# Patient Record
Sex: Female | Born: 1975 | Race: Black or African American | Hispanic: No | Marital: Single | Smoking: Never smoker
Health system: Southern US, Community
[De-identification: ages and names within clinical notes are randomized; demographics above are authoritative.]

## PROBLEM LIST (undated history)

## (undated) DIAGNOSIS — I10 Essential (primary) hypertension: Secondary | ICD-10-CM

## (undated) DIAGNOSIS — K8020 Calculus of gallbladder without cholecystitis without obstruction: Secondary | ICD-10-CM

## (undated) HISTORY — DX: Calculus of gallbladder without cholecystitis without obstruction: K80.20

## (undated) SURGERY — LAPAROSCOPIC CHOLECYSTECTOMY WITH INTRAOPERATIVE CHOLANGIOGRAM
Anesthesia: General

---

## 2008-09-12 ENCOUNTER — Emergency Department: Payer: Self-pay | Admitting: Internal Medicine

## 2011-04-02 ENCOUNTER — Emergency Department: Payer: Self-pay | Admitting: *Deleted

## 2011-04-05 ENCOUNTER — Emergency Department (HOSPITAL_COMMUNITY): Payer: BC Managed Care – PPO

## 2011-04-05 ENCOUNTER — Encounter: Payer: Self-pay | Admitting: Emergency Medicine

## 2011-04-05 ENCOUNTER — Emergency Department (HOSPITAL_COMMUNITY)
Admission: EM | Admit: 2011-04-05 | Discharge: 2011-04-05 | Disposition: A | Discharge status: Home / Self Care | Payer: BC Managed Care – PPO | Attending: Emergency Medicine | Admitting: Emergency Medicine

## 2011-04-05 DIAGNOSIS — R6883 Chills (without fever): Secondary | ICD-10-CM | POA: Insufficient documentation

## 2011-04-05 DIAGNOSIS — R112 Nausea with vomiting, unspecified: Secondary | ICD-10-CM | POA: Insufficient documentation

## 2011-04-05 DIAGNOSIS — I10 Essential (primary) hypertension: Secondary | ICD-10-CM | POA: Insufficient documentation

## 2011-04-05 DIAGNOSIS — R1013 Epigastric pain: Secondary | ICD-10-CM | POA: Insufficient documentation

## 2011-04-05 DIAGNOSIS — K8020 Calculus of gallbladder without cholecystitis without obstruction: Secondary | ICD-10-CM | POA: Insufficient documentation

## 2011-04-05 DIAGNOSIS — R10819 Abdominal tenderness, unspecified site: Secondary | ICD-10-CM | POA: Insufficient documentation

## 2011-04-05 DIAGNOSIS — Z79899 Other long term (current) drug therapy: Secondary | ICD-10-CM | POA: Insufficient documentation

## 2011-04-05 HISTORY — DX: Essential (primary) hypertension: I10

## 2011-04-05 LAB — LIPASE, BLOOD: Lipase: 20 U/L (ref 11–59)

## 2011-04-05 LAB — COMPREHENSIVE METABOLIC PANEL WITH GFR
ALT: 27 U/L (ref 0–35)
AST: 20 U/L (ref 0–37)
Albumin: 3.6 g/dL (ref 3.5–5.2)
Alkaline Phosphatase: 53 U/L (ref 39–117)
BUN: 9 mg/dL (ref 6–23)
CO2: 26 meq/L (ref 19–32)
Calcium: 9.6 mg/dL (ref 8.4–10.5)
Chloride: 101 meq/L (ref 96–112)
Creatinine, Ser: 0.72 mg/dL (ref 0.50–1.10)
GFR calc Af Amer: >90 mL/min (ref >90)
GFR calc non Af Amer: >90 mL/min (ref >90)
Glucose, Bld: 125 mg/dL — ABNORMAL HIGH (ref 70–99)
Potassium: 3.4 meq/L — ABNORMAL LOW (ref 3.5–5.1)
Sodium: 137 meq/L (ref 135–145)
Total Bilirubin: 0.3 mg/dL (ref 0.3–1.2)
Total Protein: 7.4 g/dL (ref 6.0–8.3)

## 2011-04-05 LAB — AMYLASE: Amylase: 54 U/L (ref 0–105)

## 2011-04-05 LAB — CBC
HCT: 41.4 % (ref 36.0–46.0)
Hemoglobin: 14.4 g/dL (ref 12.0–15.0)
MCH: 30.1 pg (ref 26.0–34.0)
MCHC: 34.8 g/dL (ref 30.0–36.0)
MCV: 86.6 fL (ref 78.0–100.0)
Platelets: 259 K/uL (ref 150–400)
RBC: 4.78 MIL/uL (ref 3.87–5.11)
RDW: 12.3 % (ref 11.5–15.5)
WBC: 10.1 K/uL (ref 4.0–10.5)

## 2011-04-05 MED ORDER — ONDANSETRON 8 MG PO TBDP: 8.0000 mg | ORAL_TABLET | Freq: Three times a day (TID) | ORAL | Status: AC | PRN

## 2011-04-05 MED ORDER — MORPHINE SULFATE 4 MG/ML IJ SOLN
4.0000 mg | Freq: Once | INTRAMUSCULAR | Status: AC
  Administered 2011-04-05: 4 mg via INTRAVENOUS
  Filled 2011-04-05: qty 1

## 2011-04-05 MED ORDER — ONDANSETRON HCL 4 MG/2ML IJ SOLN
4.0000 mg | Freq: Once | INTRAMUSCULAR | Status: AC
  Administered 2011-04-05: 4 mg via INTRAVENOUS
  Filled 2011-04-05: qty 2

## 2011-04-05 MED ORDER — HYDROCODONE-ACETAMINOPHEN 5-325 MG PO TABS: 1.0000 | ORAL_TABLET | ORAL | Status: DC | PRN

## 2011-04-05 NOTE — ED Provider Notes (Signed)
  Physical Exam  BP 107/64  Pulse 71  Temp(Src) 98.5 F (36.9 C) (Oral)  Resp 16  SpO2 100%  LMP 04/03/2011  Physical Exam  ED Course  Procedures  MDM US shows cholelithiasis w/out cholecystitis.  Results discussed w/ pt.  She is currently asymptomatic and tolerating pos.  D/c'd home w/ vicodin, zofran and referral to [facility].  Return precautions discussed.        Otilio Miu, [place] 04/05/11 [phone]

## 2011-04-05 NOTE — ED Notes (Signed)
PT. REPORTS UPPER ABDOMINAL PIAN ONSET THIS MORNING ( 2AM) WITH VOMITTING AND CHILLS , DENIES DIARRHEA.

## 2011-04-05 NOTE — ED Provider Notes (Signed)
History     CSN: [id number] Arrival date & time: 04/05/2011  3:39 AM   First MD Initiated Contact with Patient 04/05/11 0410      Chief Complaint  Patient presents with  . Abdominal Pain    (Consider location/radiation/quality/duration/timing/severity/associated sxs/prior treatment) Patient is a 35 y.o. female presenting with abdominal pain. The history is provided by the patient.  Abdominal Pain The primary symptoms of the illness include abdominal pain, nausea and vomiting. The primary symptoms of the illness do not include fever, fatigue, shortness of breath, diarrhea, hematemesis, hematochezia, dysuria, vaginal discharge or vaginal bleeding. The current episode started 3 to 5 hours ago. The onset of the illness was sudden.  Additional symptoms associated with the illness include chills. Symptoms associated with the illness do not include anorexia, diaphoresis, heartburn, constipation, urgency, hematuria, frequency or back pain.  Pt woke up at 2am this morning with severe epigastric pain and vomiting. She says that the same happened a few days ago. She went to [facility] where they drew blood but did not do any imaging. The patient is vomiting in exam room upon my arrival.   Past Medical History  Diagnosis Date  . Hypertension     Past Surgical History  Procedure Date  . Cesarean section     No family history on file.  History  Substance Use Topics  . Smoking status: Never Smoker   . Smokeless tobacco: Not on file  . Alcohol Use: No    OB History    Grav Para Term Preterm Abortions TAB SAB Ect Mult Living                  Review of Systems  Constitutional: Positive for chills. Negative for fever, diaphoresis and fatigue.  Respiratory: Negative for shortness of breath.   Gastrointestinal: Positive for nausea, vomiting and abdominal pain. Negative for heartburn, diarrhea, constipation, hematochezia, anorexia and hematemesis.  Genitourinary: Negative for dysuria,  urgency, frequency, hematuria, vaginal bleeding and vaginal discharge.  Musculoskeletal: Negative for back pain.  All other systems reviewed and are negative.    Allergies  Review of patient's allergies indicates no known allergies.  Home Medications   Current Outpatient Rx  Name Route Sig Dispense Refill  . HYDROCHLOROTHIAZIDE 12.5 MG PO TABS Oral Take 12.5 mg by mouth daily.      Carma Leaven M PLUS PO TABS Oral Take 1 tablet by mouth daily. Gummy     . POTASSIUM CHLORIDE 10 MEQ PO TBCR Oral Take 10 mEq by mouth daily.        BP 107/64  Pulse 71  Temp(Src) 98.5 F (36.9 C) (Oral)  Resp 16  SpO2 100%  LMP 04/03/2011  Physical Exam  Nursing note and vitals reviewed. Constitutional: She appears well-developed and well-nourished.  HENT:  Head: Normocephalic and atraumatic.  Eyes: Conjunctivae are normal. Pupils are equal, round, and reactive to light.  Neck: Trachea normal, normal range of motion and full passive range of motion without pain. Neck supple.  Cardiovascular: Normal rate, regular rhythm and normal pulses.   Pulmonary/Chest: Effort normal and breath sounds normal. Chest wall is not dull to percussion. She exhibits no tenderness, no crepitus, no edema, no deformity and no retraction.  Abdominal: Soft. Normal appearance and bowel sounds are normal. She exhibits no mass. There is tenderness (epigastric and some RUQ tenderness to palpation) in the right upper quadrant and epigastric area. There is no rebound, no guarding and no CVA tenderness.  Musculoskeletal: Normal range of motion.  Neurological: She is alert. She has normal strength.  Skin: Skin is warm, dry and intact.  Psychiatric: She has a normal mood and affect. Her speech is normal and behavior is normal. Judgment and thought content normal. Cognition and memory are normal.    ED Course  Procedures (including critical care time)  Labs Reviewed  COMPREHENSIVE METABOLIC PANEL - Abnormal; Notable for the  following:    Potassium 3.4 (*)    Glucose, Bld 125 (*)    All other components within normal limits  LIPASE, BLOOD  AMYLASE  CBC   No results found.   No diagnosis found.    MDM  Due to patients epigastric pain and this being her second visit in three days I am ordering a abdominal US. Katie Geologist, engineering PA-C has agreed to assume care of patient.        Dorthula Matas, PA 04/05/11 [phone]

## 2011-04-05 NOTE — ED Notes (Signed)
Patient presents with nausea and vomiting.  Was seen at [facility] on Tuesday and told she had a virus.  Pain N/V continues.  Pain to epigastric area that goes through to the back.  Vomiting yellow emesis

## 2011-04-05 NOTE — ED Notes (Signed)
Pt back from ultrasound, awaiting results.

## 2011-04-05 NOTE — ED Provider Notes (Signed)
Medical screening examination/treatment/procedure(s) were performed by non-physician practitioner and as supervising physician I was immediately available for consultation/collaboration.   Hanley Seamen, MD 04/05/11 [phone]

## 2011-04-05 NOTE — ED Notes (Signed)
MD at bedside. 

## 2011-04-07 ENCOUNTER — Ambulatory Visit (INDEPENDENT_AMBULATORY_CARE_PROVIDER_SITE_OTHER): Payer: BC Managed Care – PPO | Admitting: Surgery

## 2011-04-07 ENCOUNTER — Encounter (INDEPENDENT_AMBULATORY_CARE_PROVIDER_SITE_OTHER): Payer: Self-pay | Admitting: Surgery

## 2011-04-07 VITALS — BP 132/88 | HR 72 | Temp 97.4°F | Resp 16 | Ht 64.0 in | Wt 240.6 lb

## 2011-04-07 DIAGNOSIS — K8010 Calculus of gallbladder with chronic cholecystitis without obstruction: Secondary | ICD-10-CM | POA: Insufficient documentation

## 2011-04-07 NOTE — Progress Notes (Signed)
Patient ID: Autumn Farrell, female   DOB: [birth year], 35 y.o.   MRN: [id number]  Chief Complaint  Patient presents with  . Other    new pt- eval GB    HPI Autumn Farrell is a 35 y.o. female.  Referred by Dr. Read Drivers for gallstones HPI This patient presents with a one-year history of intermittent postprandial epigastric abdominal pain radiating through to her back associated with nausea and bloating. She denies any diarrhea. She had a severe episode last week for which she went to the emergency department at [facility]. She was diagnosed with gastroenteritis. The pain recurred on Saturday so she came to [facility]. Her white count and liver function tests were normal. An ultrasound showed multiple gallstones but no sign of cholecystitis. She is now referred for surgical evaluation. Past Medical History  Diagnosis Date  . Hypertension   . Gallstones     Past Surgical History  Procedure Date  . Cesarean section     History reviewed. No pertinent family history.  Social History History  Substance Use Topics  . Smoking status: Never Smoker   . Smokeless tobacco: Not on file  . Alcohol Use: No    No Known Allergies  Current Outpatient Prescriptions  Medication Sig Dispense Refill  . hydrochlorothiazide (HYDRODIURIL) 12.5 MG tablet Take 12.5 mg by mouth daily.        Marland Kitchen HYDROcodone-acetaminophen (NORCO) 5-325 MG per tablet Take 1 tablet by mouth every 4 (four) hours as needed for pain.  20 tablet  0  . Multiple Vitamins-Minerals (MULTIVITAMINS THER. W/MINERALS) TABS Take 1 tablet by mouth daily. Gummy       . ondansetron (ZOFRAN ODT) 8 MG disintegrating tablet Take 1 tablet (8 mg total) by mouth every 8 (eight) hours as needed for nausea.  20 tablet  0  . potassium chloride (KLOR-CON) 10 MEQ CR tablet Take 10 mEq by mouth daily.          Review of Systems Review of Systems  Constitutional: Negative for fever, chills and unexpected weight change.  HENT: Negative for hearing loss,  congestion, sore throat, trouble swallowing and voice change.   Eyes: Negative for visual disturbance.  Respiratory: Negative for cough and wheezing.   Cardiovascular: Negative for chest pain, palpitations and leg swelling.  Gastrointestinal: Positive for nausea, vomiting, abdominal pain and abdominal distention. Negative for diarrhea, constipation, blood in stool and anal bleeding.  Genitourinary: Negative for hematuria, vaginal bleeding and difficulty urinating.  Musculoskeletal: Negative for arthralgias.  Skin: Negative for rash and wound.  Neurological: Negative for seizures, syncope and headaches.  Hematological: Negative for adenopathy. Does not bruise/bleed easily.  Psychiatric/Behavioral: Negative for confusion.    Blood pressure 132/88, pulse 72, temperature 97.4 F (36.3 C), temperature source Temporal, resp. rate 16, height 5\' 4"  (1.626 m), weight 240 lb 9.6 oz (109.135 kg), last menstrual period 04/03/2011.  Physical Exam Physical Exam WDWN in NAD HEENT:  EOMI, sclera anicteric Neck:  No masses, no thyromegaly Lungs:  CTA bilaterally; normal respiratory effort CV:  Regular rate and rhythm; no murmurs Abd:  +bowel sounds, soft, mildly tender in the epigastrium; no palpable masses Ext:  Well-perfused; no edema Skin:  Warm, dry; no sign of jaundice  Data Reviewed U/S, labs from ED   Assessment    Chronic calculus cholecystitis     Plan    Laparoscopic cholecystectomy with intraoperative cholangiogram.  The surgical procedure has been discussed with the patient.  Potential risks, benefits, alternative treatments, and expected  outcomes have been explained.  All of the patient's questions at this time have been answered.  The likelihood of reaching the patient's treatment goal is good.  The patient understand the proposed surgical procedure and wishes to proceed.        Autumn Alicia K. 04/07/2011, 3:53 PM

## 2011-04-08 ENCOUNTER — Encounter (INDEPENDENT_AMBULATORY_CARE_PROVIDER_SITE_OTHER): Payer: Self-pay | Admitting: General Surgery

## 2011-04-08 ENCOUNTER — Encounter (HOSPITAL_COMMUNITY): Payer: Self-pay

## 2011-04-12 ENCOUNTER — Other Ambulatory Visit: Payer: Self-pay

## 2011-04-12 ENCOUNTER — Encounter (HOSPITAL_COMMUNITY): Payer: Self-pay | Admitting: Emergency Medicine

## 2011-04-12 ENCOUNTER — Inpatient Hospital Stay (HOSPITAL_COMMUNITY)
Admission: EM | Admit: 2011-04-12 | Discharge: 2011-04-14 | DRG: 494 | Disposition: A | Discharge status: Home / Self Care | Payer: BC Managed Care – PPO | Attending: General Surgery | Admitting: General Surgery

## 2011-04-12 ENCOUNTER — Emergency Department (HOSPITAL_COMMUNITY): Payer: BC Managed Care – PPO

## 2011-04-12 DIAGNOSIS — K8020 Calculus of gallbladder without cholecystitis without obstruction: Secondary | ICD-10-CM

## 2011-04-12 DIAGNOSIS — I10 Essential (primary) hypertension: Secondary | ICD-10-CM | POA: Diagnosis present

## 2011-04-12 DIAGNOSIS — K812 Acute cholecystitis with chronic cholecystitis: Secondary | ICD-10-CM | POA: Diagnosis present

## 2011-04-12 DIAGNOSIS — Z6841 Body Mass Index (BMI) 40.0 and over, adult: Secondary | ICD-10-CM

## 2011-04-12 DIAGNOSIS — K8000 Calculus of gallbladder with acute cholecystitis without obstruction: Principal | ICD-10-CM | POA: Diagnosis present

## 2011-04-12 DIAGNOSIS — I2510 Atherosclerotic heart disease of native coronary artery without angina pectoris: Secondary | ICD-10-CM | POA: Diagnosis present

## 2011-04-12 DIAGNOSIS — R1011 Right upper quadrant pain: Secondary | ICD-10-CM

## 2011-04-12 DIAGNOSIS — R112 Nausea with vomiting, unspecified: Secondary | ICD-10-CM | POA: Diagnosis present

## 2011-04-12 DIAGNOSIS — K819 Cholecystitis, unspecified: Secondary | ICD-10-CM

## 2011-04-12 DIAGNOSIS — I4891 Unspecified atrial fibrillation: Secondary | ICD-10-CM | POA: Diagnosis present

## 2011-04-12 LAB — DIFFERENTIAL
Basophils Absolute: 0 10*3/uL (ref 0.0–0.1)
Basophils Relative: 0 % (ref 0–1)
Eosinophils Absolute: 0.1 10*3/uL (ref 0.0–0.7)
Eosinophils Relative: 1 % (ref 0–5)
Lymphocytes Relative: 29 % (ref 12–46)
Lymphs Abs: 2.7 10*3/uL (ref 0.7–4.0)
Monocytes Absolute: 0.7 10*3/uL (ref 0.1–1.0)
Monocytes Relative: 8 % (ref 3–12)
Neutro Abs: 5.8 10*3/uL (ref 1.7–7.7)
Neutrophils Relative %: 62 % (ref 43–77)

## 2011-04-12 LAB — COMPREHENSIVE METABOLIC PANEL
ALT: 42 U/L — ABNORMAL HIGH (ref 0–35)
AST: 30 U/L (ref 0–37)
Albumin: 3.6 g/dL (ref 3.5–5.2)
Alkaline Phosphatase: 60 U/L (ref 39–117)
BUN: 12 mg/dL (ref 6–23)
CO2: 23 mEq/L (ref 19–32)
Calcium: 9.8 mg/dL (ref 8.4–10.5)
Chloride: 103 mEq/L (ref 96–112)
Creatinine, Ser: 0.71 mg/dL (ref 0.50–1.10)
GFR calc Af Amer: >90 mL/min (ref >90)
GFR calc non Af Amer: >90 mL/min (ref >90)
Glucose, Bld: 135 mg/dL — ABNORMAL HIGH (ref 70–99)
Potassium: 3.1 mEq/L — ABNORMAL LOW (ref 3.5–5.1)
Sodium: 139 mEq/L (ref 135–145)
Total Bilirubin: 0.5 mg/dL (ref 0.3–1.2)
Total Protein: 7.8 g/dL (ref 6.0–8.3)

## 2011-04-12 LAB — CBC
HCT: 41.2 % (ref 36.0–46.0)
Hemoglobin: 14.4 g/dL (ref 12.0–15.0)
MCH: 30.1 pg (ref 26.0–34.0)
MCHC: 35 g/dL (ref 30.0–36.0)
MCV: 86.2 fL (ref 78.0–100.0)
Platelets: 240 10*3/uL (ref 150–400)
RBC: 4.78 MIL/uL (ref 3.87–5.11)
RDW: 12.3 % (ref 11.5–15.5)
WBC: 9.3 10*3/uL (ref 4.0–10.5)

## 2011-04-12 LAB — LIPASE, BLOOD: Lipase: 22 U/L (ref 11–59)

## 2011-04-12 MED ORDER — PANTOPRAZOLE SODIUM 40 MG IV SOLR
40.0000 mg | Freq: Every day | INTRAVENOUS | Status: DC
  Administered 2011-04-12 – 2011-04-13 (×2): 40 mg via INTRAVENOUS
  Filled 2011-04-12 (×3): qty 40

## 2011-04-12 MED ORDER — ACETAMINOPHEN 650 MG RE SUPP: 650.0000 mg | Freq: Four times a day (QID) | RECTAL | Status: DC | PRN

## 2011-04-12 MED ORDER — AMPICILLIN-SULBACTAM SODIUM 3 (2-1) G IJ SOLR
3.0000 g | Freq: Four times a day (QID) | INTRAMUSCULAR | Status: DC
  Administered 2011-04-12 – 2011-04-14 (×7): 3 g via INTRAVENOUS
  Filled 2011-04-12 (×10): qty 3

## 2011-04-12 MED ORDER — ACETAMINOPHEN 325 MG PO TABS
650.0000 mg | ORAL_TABLET | Freq: Four times a day (QID) | ORAL | Status: DC | PRN
  Administered 2011-04-13: 650 mg via ORAL
  Filled 2011-04-12: qty 2

## 2011-04-12 MED ORDER — SODIUM CHLORIDE 0.9 % IV SOLN
3.0000 g | INTRAVENOUS | Status: AC
  Administered 2011-04-13: 3 g via INTRAVENOUS
  Filled 2011-04-12: qty 3

## 2011-04-12 MED ORDER — DIPHENHYDRAMINE HCL 12.5 MG/5ML PO ELIX
12.5000 mg | ORAL_SOLUTION | Freq: Four times a day (QID) | ORAL | Status: DC | PRN
  Filled 2011-04-12: qty 10

## 2011-04-12 MED ORDER — CEFAZOLIN SODIUM-DEXTROSE 2-3 GM-% IV SOLR
2.0000 g | INTRAVENOUS | Status: AC
  Administered 2011-04-13: 2 g via INTRAVENOUS
  Filled 2011-04-12: qty 50

## 2011-04-12 MED ORDER — HYDROMORPHONE HCL PF 1 MG/ML IJ SOLN
1.0000 mg | Freq: Once | INTRAMUSCULAR | Status: AC
  Administered 2011-04-12: 1 mg via INTRAVENOUS
  Filled 2011-04-12: qty 1

## 2011-04-12 MED ORDER — ONDANSETRON HCL 4 MG/2ML IJ SOLN
4.0000 mg | Freq: Once | INTRAMUSCULAR | Status: AC
  Administered 2011-04-12: 4 mg via INTRAVENOUS
  Filled 2011-04-12: qty 2

## 2011-04-12 MED ORDER — KCL IN DEXTROSE-NACL 20-5-0.45 MEQ/L-%-% IV SOLN
INTRAVENOUS | Status: DC
  Administered 2011-04-13 – 2011-04-14 (×3): via INTRAVENOUS
  Filled 2011-04-12 (×6): qty 1000

## 2011-04-12 MED ORDER — HYDROCHLOROTHIAZIDE 25 MG PO TABS: 12.5000 mg | ORAL_TABLET | Freq: Every day | ORAL | Status: DC

## 2011-04-12 MED ORDER — SODIUM CHLORIDE 0.9 % IV BOLUS (SEPSIS)
1000.0000 mL | Freq: Once | INTRAVENOUS | Status: AC
  Administered 2011-04-12: 1000 mL via INTRAVENOUS

## 2011-04-12 MED ORDER — KCL IN DEXTROSE-NACL 20-5-0.45 MEQ/L-%-% IV SOLN
INTRAVENOUS | Status: AC
  Administered 2011-04-12: 14:00:00 via INTRAVENOUS
  Filled 2011-04-12: qty 1000

## 2011-04-12 MED ORDER — HYDROCHLOROTHIAZIDE 12.5 MG PO CAPS
12.5000 mg | ORAL_CAPSULE | Freq: Every day | ORAL | Status: DC
  Administered 2011-04-12 – 2011-04-14 (×3): 12.5 mg via ORAL
  Filled 2011-04-12 (×3): qty 1

## 2011-04-12 MED ORDER — HYDROMORPHONE HCL PF 1 MG/ML IJ SOLN
1.0000 mg | INTRAMUSCULAR | Status: DC | PRN
  Administered 2011-04-12 (×2): 1 mg via INTRAVENOUS
  Filled 2011-04-12 (×2): qty 1

## 2011-04-12 MED ORDER — DIPHENHYDRAMINE HCL 50 MG/ML IJ SOLN: 12.5000 mg | Freq: Four times a day (QID) | INTRAMUSCULAR | Status: DC | PRN

## 2011-04-12 MED ORDER — PROMETHAZINE HCL 25 MG/ML IJ SOLN
12.5000 mg | INTRAMUSCULAR | Status: AC
  Administered 2011-04-12: 12.5 mg via INTRAVENOUS
  Filled 2011-04-12: qty 1

## 2011-04-12 MED ORDER — ONDANSETRON HCL 4 MG/2ML IJ SOLN: 4.0000 mg | Freq: Four times a day (QID) | INTRAMUSCULAR | Status: DC | PRN

## 2011-04-12 NOTE — H&P (Signed)
Chief Complaint: Abd pain, known gallstones. HPI: Autumn Farrell is an 35 y.o. female with known history of gallstones who has seen Dr. Corliss Skains and was actually scheduled for elective chole on 04/25/11. However, she has had a recurrent of her sxs and she continues to c/o pain. She presented to the ER where a new [place] now shows some wall thickening in addition to her stones. Surhery consult requested.  Past Medical History:  Past Medical History  Diagnosis Date  . Hypertension   . Gallstones     Past Surgical History:  Past Surgical History  Procedure Date  . Cesarean section     Family History: History reviewed. No pertinent family history.  Social History:  reports that she has never smoked. She does not have any smokeless tobacco history on file. She reports that she does not drink alcohol or use illicit drugs.  Allergies: No Known Allergies  Medications:  Medications Prior to Admission  Medication Sig Dispense Refill  . hydrochlorothiazide (HYDRODIURIL) 12.5 MG tablet Take 12.5 mg by mouth daily.        Marland Kitchen HYDROcodone-acetaminophen (NORCO) 5-325 MG per tablet Take 1 tablet by mouth every 4 (four) hours as needed for pain.  20 tablet  0  . Multiple Vitamins-Minerals (MULTIVITAMINS THER. W/MINERALS) TABS Take 1 tablet by mouth daily. Gummy       . ondansetron (ZOFRAN ODT) 8 MG disintegrating tablet Take 1 tablet (8 mg total) by mouth every 8 (eight) hours as needed for nausea.  20 tablet  0  . potassium chloride (KLOR-CON) 10 MEQ CR tablet Take 10 mEq by mouth daily.          See HPI for pertinent positives, otherwise negative 10 system review.  Blood pressure 119/74, pulse 85, temperature 98.3 F (36.8 C), temperature source Oral, resp. rate 18, height 5\' 4"  (1.626 m), weight 108.863 kg (240 lb), last menstrual period 04/03/2011, SpO2 95.00%. Body mass index is 41.20 kg/(m^2).   General Appearance:  Alert, cooperative, no distress,  obese AA female  Head:  Normocephalic, without  obvious abnormality, atraumatic  ENT: Unremarkable  Neck: Supple, symmetrical, trachea midline, no adenopathy, thyroid: not enlarged, symmetric, no tenderness/mass/nodules  Lungs:   Clear to auscultation bilaterally, no w/r/r, respirations unlabored without use of accessory muscles.  Chest Wall:  No tenderness or deformity  Heart:  Regular rate and rhythm, S1, S2 normal, no murmur, rub or gallop. Carotids 2+ without bruit.  Abdomen:   Soft non distended. Tender RUQ. Bowel sounds active all four quadrants,  no masses, no organomegaly.  Genitalia:  Normal. No hernias  Rectal:  Deferred.  Extremities: Extremities normal, atraumatic, no cyanosis or edema  Pulses: 2+ and symmetric  Skin: Skin color, texture, turgor normal, no rashes or lesions  Neurologic: Normal affect, no gross deficits.   Results for orders placed during the hospital encounter of 04/12/11 (from the past 48 hour(s))  CBC     Status: Normal   Collection Time   04/12/11  6:55 AM      Component Value Range Comment   WBC 9.3  4.0 - 10.5 (K/uL)    RBC 4.78  3.87 - 5.11 (MIL/uL)    Hemoglobin 14.4  12.0 - 15.0 (g/dL)    HCT 40.9  81.1 - 91.4 (%)    MCV 86.2  78.0 - 100.0 (fL)    MCH 30.1  26.0 - 34.0 (pg)    MCHC 35.0  30.0 - 36.0 (g/dL)    RDW 12.3  11.5 - 15.5 (%)    Platelets 240  150 - 400 (K/uL)   DIFFERENTIAL     Status: Normal   Collection Time   04/12/11  6:55 AM      Component Value Range Comment   Neutrophils Relative 62  43 - 77 (%)    Neutro Abs 5.8  1.7 - 7.7 (K/uL)    Lymphocytes Relative 29  12 - 46 (%)    Lymphs Abs 2.7  0.7 - 4.0 (K/uL)    Monocytes Relative 8  3 - 12 (%)    Monocytes Absolute 0.7  0.1 - 1.0 (K/uL)    Eosinophils Relative 1  0 - 5 (%)    Eosinophils Absolute 0.1  0.0 - 0.7 (K/uL)    Basophils Relative 0  0 - 1 (%)    Basophils Absolute 0.0  0.0 - 0.1 (K/uL)   COMPREHENSIVE METABOLIC PANEL     Status: Abnormal   Collection Time   04/12/11  6:55 AM      Component Value Range Comment    Sodium 139  135 - 145 (mEq/L)    Potassium 3.1 (*) 3.5 - 5.1 (mEq/L)    Chloride 103  96 - 112 (mEq/L)    CO2 23  19 - 32 (mEq/L)    Glucose, Bld 135 (*) 70 - 99 (mg/dL)    BUN 12  6 - 23 (mg/dL)    Creatinine, Ser 4.09  0.50 - 1.10 (mg/dL)    Calcium 9.8  8.4 - 10.5 (mg/dL)    Total Protein 7.8  6.0 - 8.3 (g/dL)    Albumin 3.6  3.5 - 5.2 (g/dL)    AST 30  0 - 37 (U/L)    ALT 42 (*) 0 - 35 (U/L)    Alkaline Phosphatase 60  39 - 117 (U/L)    Total Bilirubin 0.5  0.3 - 1.2 (mg/dL)    GFR calc non Af Amer >90  >90 (mL/min)    GFR calc Af Amer >90  >90 (mL/min)   LIPASE, BLOOD     Status: Normal   Collection Time   04/12/11  6:55 AM      Component Value Range Comment   Lipase 22  11 - 59 (U/L)    US Abdomen Complete  04/12/2011  *RADIOLOGY REPORT*  Clinical Data:  Right upper quadrant pain.  Cholecystitis.  COMPLETE ABDOMINAL ULTRASOUND  Comparison:  04/05/2011.  Findings:  Gallbladder:  Gallstones are present within the gallbladder.  The gallbladder wall is borderline at 3 mm.  There is a positive sonographic Murphy's sign.  No pericholecystic fluid is present.  Common bile duct:  5 mm, borderline for age.  If cholecystectomy is performed, intraoperative cholangiogram is recommended.  No common duct stone is identified.  Liver:  No focal lesion identified.  Within normal limits in parenchymal echogenicity.  IVC:  Appears normal.  Pancreas:  No focal abnormality seen.  Spleen:  97 mm.  Normal echotexture.  Right Kidney:  12 cm. Normal echotexture.  Normal central sinus echo complex.  No calculi or hydronephrosis.  Left Kidney:  12.1 cm. Normal echotexture.  Normal central sinus echo complex.  No calculi or hydronephrosis.  Abdominal aorta:  18 mm with anatomic tapering.  IMPRESSION: 1.  Cholelithiasis with positive sonographic Murphy's sign and borderline wall thickening. This is highly suspicious for early acute cholecystitis. 2.Mild prominence of the common bile duct.  Original Report  Authenticated By: Andreas Newport, M.D.    Assessment/Plan Principal  Problem:  *Cholecystitis chronic, acute Will admit for iV fluids, antiemetics, pain control Plan lap chole this amdit D/W pt and mother.   Marianna Fuss 04/12/2011, 12:39 PM

## 2011-04-12 NOTE — ED Provider Notes (Signed)
History     CSN: [id number]  Arrival date & time 04/12/11  2130   First MD Initiated Contact with Patient 04/12/11 0645      Chief Complaint  Patient presents with  . Abdominal Pain    (Consider location/radiation/quality/duration/timing/severity/associated sxs/prior treatment) HPI  Autumn Farrell is a 35 y.o. female who presents to the emergency department with abdominal pain and persistent vomiting since 5 am.  Pt seen in the ED on 04/05/11 and Dx with chololithiasis w/o colicystitis via [place].  Pt was given a referral to [facility].  Pt had consult with [facility] and has a cholecystectomy scheduled for 04/24/10.  Pt states she awoke at 5 am with severe abdominal pain, took her hydrocodone without relief and began vomiting.  She denies CP, shortness of breath, diarrhea, edema.  She has been unable to pinpoint a food trigger.    Past Medical History  Diagnosis Date  . Hypertension   . Gallstones     Past Surgical History  Procedure Date  . Cesarean section     History reviewed. No pertinent family history.  History  Substance Use Topics  . Smoking status: Never Smoker   . Smokeless tobacco: Not on file  . Alcohol Use: No    OB History    Grav Para Term Preterm Abortions TAB SAB Ect Mult Living                  Review of Systems All pertinent positives and negatives in the history of present illness  Allergies  Review of patient's allergies indicates no known allergies.  Home Medications   Current Outpatient Rx  Name Route Sig Dispense Refill  . HYDROCHLOROTHIAZIDE 12.5 MG PO TABS Oral Take 12.5 mg by mouth daily.      Marland Kitchen HYDROCODONE-ACETAMINOPHEN 5-325 MG PO TABS Oral Take 1 tablet by mouth every 4 (four) hours as needed for pain. 20 tablet 0  . THERA M PLUS PO TABS Oral Take 1 tablet by mouth daily. Gummy     . ONDANSETRON 8 MG PO TBDP Oral Take 1 tablet (8 mg total) by mouth every 8 (eight) hours as needed for nausea. 20 tablet 0  . POTASSIUM CHLORIDE  10 MEQ PO TBCR Oral Take 10 mEq by mouth daily.        BP 150/99  Pulse 103  Temp(Src) 98.3 F (36.8 C) (Oral)  Resp 20  Ht 5\' 4"  (1.626 m)  Wt 240 lb (108.863 kg)  BMI 41.20 kg/m2  SpO2 100%  LMP 04/03/2011  Physical Exam  Constitutional: She is oriented to person, place, and time. She appears well-developed and well-nourished.  HENT:  Head: Normocephalic and atraumatic.  Right Ear: External ear normal.  Left Ear: External ear normal.  Nose: Nose normal.  Mouth/Throat: Oropharynx is clear and moist.  Eyes: Pupils are equal, round, and reactive to light.  Neck: Normal range of motion.  Cardiovascular: Normal rate, regular rhythm, normal heart sounds and intact distal pulses.  Exam reveals no gallop and no friction rub.   No murmur heard. Pulmonary/Chest: Effort normal and breath sounds normal. No respiratory distress. She has no wheezes. She has no rales. She exhibits no tenderness.  Abdominal: Soft. Normal appearance and bowel sounds are normal. She exhibits no distension and no fluid wave. There is no hepatosplenomegaly. There is generalized tenderness. There is guarding. There is no CVA tenderness.  Musculoskeletal: Normal range of motion.  Lymphadenopathy:    She has no cervical adenopathy.  Neurological: She is alert and oriented to person, place, and time.  Skin: Skin is warm and dry.  Psychiatric: She has a normal mood and affect. Her behavior is normal. Judgment and thought content normal.    ED Course  Procedures (including critical care time)   Labs Reviewed  CBC  DIFFERENTIAL  COMPREHENSIVE METABOLIC PANEL  LIPASE, BLOOD   12:22 PM Assessment the surgery in about the patient and advised them that her pain was not resolved enough for her to feel comfortable going home.  They will come down and evaluate the patient here in the emergency department.       MDM  Cholelithiasis with possible mild early cholecystitis.        Carlyle Dolly,  PA-C 04/12/11 1223

## 2011-04-12 NOTE — ED Notes (Signed)
Ultrasound tech called and asking for pt to be brought over to ultrasound department; RN notified

## 2011-04-12 NOTE — Plan of Care (Signed)
Problem: Diagnosis - Type of Surgery Goal: General Surgical Patient Education (See Patient Education module for education specifics)  Pending or today and or tomorrow am

## 2011-04-12 NOTE — H&P (Signed)
Patient examined and I agree with the assessment and plan No OR availability tonight due to emergencies. Will allow clears and make NPO after midnight. Autumn Farrell E

## 2011-04-12 NOTE — ED Notes (Signed)
CDU Secretary paging general surgeon.

## 2011-04-12 NOTE — ED Notes (Signed)
Patient transported to Ultrasound 

## 2011-04-12 NOTE — ED Notes (Signed)
Scheduled for [place]oley  On 04/25/11--at Short Stay. Vommitting bile fluids now.

## 2011-04-13 ENCOUNTER — Inpatient Hospital Stay (HOSPITAL_COMMUNITY): Payer: BC Managed Care – PPO

## 2011-04-13 ENCOUNTER — Encounter (HOSPITAL_COMMUNITY): Payer: Self-pay | Admitting: Anesthesiology

## 2011-04-13 ENCOUNTER — Encounter (HOSPITAL_COMMUNITY): Admission: EM | Disposition: A | Discharge status: Home / Self Care | Payer: Self-pay | Source: Home / Self Care

## 2011-04-13 ENCOUNTER — Inpatient Hospital Stay (HOSPITAL_COMMUNITY): Payer: BC Managed Care – PPO | Admitting: Anesthesiology

## 2011-04-13 ENCOUNTER — Other Ambulatory Visit (INDEPENDENT_AMBULATORY_CARE_PROVIDER_SITE_OTHER): Payer: Self-pay | Admitting: General Surgery

## 2011-04-13 DIAGNOSIS — K812 Acute cholecystitis with chronic cholecystitis: Secondary | ICD-10-CM

## 2011-04-13 HISTORY — PX: CHOLECYSTECTOMY: SHX55

## 2011-04-13 SURGERY — LAPAROSCOPIC CHOLECYSTECTOMY WITH INTRAOPERATIVE CHOLANGIOGRAM
Anesthesia: General | Site: Abdomen | Wound class: Contaminated

## 2011-04-13 MED ORDER — FENTANYL CITRATE 0.05 MG/ML IJ SOLN
INTRAMUSCULAR | Status: DC | PRN
  Administered 2011-04-13: 50 ug via INTRAVENOUS
  Administered 2011-04-13: 150 ug via INTRAVENOUS
  Administered 2011-04-13: 100 ug via INTRAVENOUS

## 2011-04-13 MED ORDER — DEXTROSE 5 % IV SOLN
INTRAVENOUS | Status: DC | PRN
  Administered 2011-04-13: 10:00:00 via INTRAVENOUS

## 2011-04-13 MED ORDER — DEXAMETHASONE SODIUM PHOSPHATE 4 MG/ML IJ SOLN
INTRAMUSCULAR | Status: DC | PRN
  Administered 2011-04-13: 4 mg via INTRAVENOUS

## 2011-04-13 MED ORDER — PROMETHAZINE HCL 25 MG/ML IJ SOLN: 6.2500 mg | INTRAMUSCULAR | Status: DC | PRN

## 2011-04-13 MED ORDER — HYDROCODONE-ACETAMINOPHEN 5-325 MG PO TABS
1.0000 | ORAL_TABLET | ORAL | Status: DC | PRN
  Administered 2011-04-14: 2 via ORAL
  Filled 2011-04-13: qty 2

## 2011-04-13 MED ORDER — SODIUM CHLORIDE 0.9 % IR SOLN
Status: DC | PRN
  Administered 2011-04-13: 1000 mL

## 2011-04-13 MED ORDER — LACTATED RINGERS IV SOLN
INTRAVENOUS | Status: DC | PRN
  Administered 2011-04-13 (×2): via INTRAVENOUS

## 2011-04-13 MED ORDER — NEOSTIGMINE METHYLSULFATE 1 MG/ML IJ SOLN
INTRAMUSCULAR | Status: DC | PRN
  Administered 2011-04-13: 5 mg via INTRAVENOUS

## 2011-04-13 MED ORDER — PROPOFOL 10 MG/ML IV EMUL
INTRAVENOUS | Status: DC | PRN
  Administered 2011-04-13: 200 mg via INTRAVENOUS

## 2011-04-13 MED ORDER — MIDAZOLAM HCL 5 MG/5ML IJ SOLN
INTRAMUSCULAR | Status: DC | PRN
  Administered 2011-04-13: 2 mg via INTRAVENOUS

## 2011-04-13 MED ORDER — SODIUM CHLORIDE 0.9 % IV SOLN
INTRAVENOUS | Status: DC | PRN
  Administered 2011-04-13: 11:00:00 via INTRAVENOUS

## 2011-04-13 MED ORDER — ONDANSETRON HCL 4 MG/2ML IJ SOLN
INTRAMUSCULAR | Status: DC | PRN
  Administered 2011-04-13: 4 mg via INTRAVENOUS

## 2011-04-13 MED ORDER — DROPERIDOL 2.5 MG/ML IJ SOLN
INTRAMUSCULAR | Status: DC | PRN
  Administered 2011-04-13: 0.625 mg via INTRAVENOUS

## 2011-04-13 MED ORDER — VECURONIUM BROMIDE 10 MG IV SOLR
INTRAVENOUS | Status: DC | PRN
  Administered 2011-04-13: 2 mg via INTRAVENOUS

## 2011-04-13 MED ORDER — GLYCOPYRROLATE 0.2 MG/ML IJ SOLN
INTRAMUSCULAR | Status: DC | PRN
  Administered 2011-04-13: .6 mg via INTRAVENOUS

## 2011-04-13 MED ORDER — ROCURONIUM BROMIDE 100 MG/10ML IV SOLN
INTRAVENOUS | Status: DC | PRN
  Administered 2011-04-13: 50 mg via INTRAVENOUS

## 2011-04-13 MED ORDER — HYDROMORPHONE HCL PF 1 MG/ML IJ SOLN
0.2500 mg | INTRAMUSCULAR | Status: DC | PRN
  Administered 2011-04-13: 0.5 mg via INTRAVENOUS

## 2011-04-13 SURGICAL SUPPLY — 50 items
APPLIER CLIP 5 13 M/L LIGAMAX5 (MISCELLANEOUS) ×2
APPLIER CLIP ROT 10 11.4 M/L (STAPLE)
BLADE SURG ROTATE 9660 (MISCELLANEOUS) IMPLANT
CANISTER SUCTION 2500CC (MISCELLANEOUS) ×2 IMPLANT
CHLORAPREP W/TINT 26ML (MISCELLANEOUS) ×2 IMPLANT
CLIP APPLIE 5 13 M/L LIGAMAX5 (MISCELLANEOUS) ×1 IMPLANT
CLIP APPLIE ROT 10 11.4 M/L (STAPLE) IMPLANT
CLOSURE STERI STRIP 1/2 X4 (GAUZE/BANDAGES/DRESSINGS) ×2 IMPLANT
CLOTH BEACON ORANGE TIMEOUT ST (SAFETY) ×2 IMPLANT
COVER MAYO STAND STRL (DRAPES) ×2 IMPLANT
COVER SURGICAL LIGHT HANDLE (MISCELLANEOUS) ×2 IMPLANT
DECANTER SPIKE VIAL GLASS SM (MISCELLANEOUS) ×4 IMPLANT
DERMABOND ADHESIVE PROPEN (GAUZE/BANDAGES/DRESSINGS) ×1
DERMABOND ADVANCED (GAUZE/BANDAGES/DRESSINGS) ×1
DERMABOND ADVANCED .7 DNX12 (GAUZE/BANDAGES/DRESSINGS) ×1 IMPLANT
DERMABOND ADVANCED .7 DNX6 (GAUZE/BANDAGES/DRESSINGS) ×1 IMPLANT
DRAPE C-ARM 42X72 X-RAY (DRAPES) ×2 IMPLANT
DRAPE UTILITY 15X26 W/TAPE STR (DRAPE) ×4 IMPLANT
ELECT REM PT RETURN 9FT ADLT (ELECTROSURGICAL) ×2
ELECTRODE REM PT RTRN 9FT ADLT (ELECTROSURGICAL) ×1 IMPLANT
GAUZE SPONGE 2X2 8PLY STRL LF (GAUZE/BANDAGES/DRESSINGS) ×1 IMPLANT
GLOVE BIOGEL PI IND STRL 7.5 (GLOVE) ×1 IMPLANT
GLOVE BIOGEL PI IND STRL 8 (GLOVE) ×1 IMPLANT
GLOVE BIOGEL PI INDICATOR 7.5 (GLOVE) ×1
GLOVE BIOGEL PI INDICATOR 8 (GLOVE) ×1
GLOVE ECLIPSE 7.5 STRL STRAW (GLOVE) ×2 IMPLANT
GLOVE SKINSENSE NS SZ6.5 (GLOVE) ×1
GLOVE SKINSENSE NS SZ7.0 (GLOVE) ×1
GLOVE SKINSENSE STRL SZ6.5 (GLOVE) ×1 IMPLANT
GLOVE SKINSENSE STRL SZ7.0 (GLOVE) ×1 IMPLANT
GOWN STRL NON-REIN LRG LVL3 (GOWN DISPOSABLE) ×4 IMPLANT
KIT BASIN OR (CUSTOM PROCEDURE TRAY) ×2 IMPLANT
KIT ROOM TURNOVER OR (KITS) ×2 IMPLANT
NS IRRIG 1000ML POUR BTL (IV SOLUTION) ×2 IMPLANT
PAD ARMBOARD 7.5X6 YLW CONV (MISCELLANEOUS) ×4 IMPLANT
POUCH SPECIMEN RETRIEVAL 10MM (ENDOMECHANICALS) IMPLANT
SCISSORS LAP 5X35 DISP (ENDOMECHANICALS) ×2 IMPLANT
SET CHOLANGIOGRAPH 5 50 .035 (SET/KITS/TRAYS/PACK) ×2 IMPLANT
SET IRRIG TUBING LAPAROSCOPIC (IRRIGATION / IRRIGATOR) ×2 IMPLANT
SLEEVE ENDOPATH XCEL 5M (ENDOMECHANICALS) ×4 IMPLANT
SPECIMEN JAR SMALL (MISCELLANEOUS) ×2 IMPLANT
SPONGE GAUZE 2X2 STER 10/PKG (GAUZE/BANDAGES/DRESSINGS) ×1
SUT MNCRL AB 4-0 PS2 18 (SUTURE) ×2 IMPLANT
TOWEL OR 17X24 6PK STRL BLUE (TOWEL DISPOSABLE) ×2 IMPLANT
TOWEL OR 17X26 10 PK STRL BLUE (TOWEL DISPOSABLE) ×2 IMPLANT
TRAY LAPAROSCOPIC (CUSTOM PROCEDURE TRAY) ×2 IMPLANT
TROCAR XCEL BLUNT TIP 100MML (ENDOMECHANICALS) ×2 IMPLANT
TROCAR XCEL NON-BLD 11X100MML (ENDOMECHANICALS) IMPLANT
TROCAR XCEL NON-BLD 5MMX100MML (ENDOMECHANICALS) ×2 IMPLANT
WATER STERILE IRR 1000ML POUR (IV SOLUTION) IMPLANT

## 2011-04-13 NOTE — Anesthesia Procedure Notes (Signed)
Procedure Name: Intubation Date/Time: 04/13/2011 10:36 AM Performed by: Wray Kearns A Pre-anesthesia Checklist: Patient identified, Timeout performed, Emergency Drugs available, Suction available and Patient being monitored Patient Re-evaluated:Patient Re-evaluated prior to inductionOxygen Delivery Method: Circle System Utilized Preoxygenation: Pre-oxygenation with 100% oxygen Intubation Type: IV induction Ventilation: Mask ventilation without difficulty and Oral airway inserted - appropriate to patient size Laryngoscope Size: Mac and 3 Grade View: Grade I Tube type: Oral Tube size: 7.0 mm Number of attempts: 1 Airway Equipment and Method: stylet Placement Confirmation: ETT inserted through vocal cords under direct vision,  breath sounds checked- equal and bilateral and positive ETCO2 Secured at: 21 cm Tube secured with: Tape Dental Injury: Teeth and Oropharynx as per pre-operative assessment

## 2011-04-13 NOTE — Progress Notes (Signed)
GS Progress Note Subjective: The patient is less symptomatic today than yesterday.  Will plan on taking her to the operating room for cholecystectomy.  Objective: Vital signs in last 24 hours: Temp:  [98.5 F (36.9 C)-98.7 F (37.1 C)] 98.7 F (37.1 C) (12/22 2143) Pulse Rate:  [83-86] 86  (12/22 2143) Resp:  [18-20] 18  (12/22 2143) BP: (114-145)/(66-99) 132/66 mmHg (12/22 2143) SpO2:  [95 %-100 %] 100 % (12/22 2143) Last BM Date: 04/11/11  Intake/Output from previous day:   Intake/Output this shift:    Lungs: Clear  Abd: Soft, mild tenderness in the RUQ  Extremities: No DVT signs or symptoms.  Neuro: Intact  Lab Results: CBC   Basename 04/12/11 0655  WBC 9.3  HGB 14.4  HCT 41.2  PLT 240   BMET  Basename 04/12/11 0655  NA 139  K 3.1*  CL 103  CO2 23  GLUCOSE 135*  BUN 12  CREATININE 0.71  CALCIUM 9.8   PT/INR No results found for this basename: LABPROT:2,INR:2 in the last 72 hours ABG No results found for this basename: PHART:2,PCO2:2,PO2:2,HCO3:2 in the last 72 hours  Studies/Results: US Abdomen Complete  04/12/2011  *RADIOLOGY REPORT*  Clinical Data:  Right upper quadrant pain.  Cholecystitis.  COMPLETE ABDOMINAL ULTRASOUND  Comparison:  04/05/2011.  Findings:  Gallbladder:  Gallstones are present within the gallbladder.  The gallbladder wall is borderline at 3 mm.  There is a positive sonographic Murphy's sign.  No pericholecystic fluid is present.  Common bile duct:  5 mm, borderline for age.  If cholecystectomy is performed, intraoperative cholangiogram is recommended.  No common duct stone is identified.  Liver:  No focal lesion identified.  Within normal limits in parenchymal echogenicity.  IVC:  Appears normal.  Pancreas:  No focal abnormality seen.  Spleen:  97 mm.  Normal echotexture.  Right Kidney:  12 cm. Normal echotexture.  Normal central sinus echo complex.  No calculi or hydronephrosis.  Left Kidney:  12.1 cm. Normal echotexture.  Normal  central sinus echo complex.  No calculi or hydronephrosis.  Abdominal aorta:  18 mm with anatomic tapering.  IMPRESSION: 1.  Cholelithiasis with positive sonographic Murphy's sign and borderline wall thickening. This is highly suspicious for early acute cholecystitis. 2.Mild prominence of the common bile duct.  Original Report Authenticated By: Andreas Newport, M.D.    Anti-infectives: Anti-infectives     Start     Dose/Rate Route Frequency Ordered Stop   04/12/11 1600   Ampicillin-Sulbactam (UNASYN) 3 g in sodium chloride 0.9 % 100 mL IVPB        3 g 100 mL/hr over 60 Minutes Intravenous Every 6 hours 04/12/11 1247     04/12/11 1530   ceFAZolin (ANCEF) IVPB 2 g/50 mL premix        2 g 100 mL/hr over 30 Minutes Intravenous 60 min pre-op 04/12/11 1518     04/12/11 1330   Ampicillin-Sulbactam (UNASYN) 3 g in sodium chloride 0.9 % 100 mL IVPB        3 g 100 mL/hr over 60 Minutes Intravenous [facility] 04/12/11 1304 04/13/11 1330          Assessment/Plan: s/p  To OR today for laparoscopic cholecystectomy with IOC Only mild abnormality of her LFTs.  LOS: 1 day    Marta Lamas. Autumn Bon, MD, FACS [phone] [facility] 04/13/2011

## 2011-04-13 NOTE — Anesthesia Postprocedure Evaluation (Signed)
  Anesthesia Post-op Note  Patient: Autumn Farrell  Procedure(s) Performed:  LAPAROSCOPIC CHOLECYSTECTOMY WITH INTRAOPERATIVE CHOLANGIOGRAM  Patient Location: PACU  Anesthesia Type: General  Level of Consciousness: awake, alert  and oriented  Airway and Oxygen Therapy: Patient Spontanous Breathing  Post-op Pain: mild  Post-op Assessment: Post-op Vital signs reviewed, Patient's Cardiovascular Status Stable, Respiratory Function Stable, Patent Airway, No signs of Nausea or vomiting and Pain level controlled  Post-op Vital Signs: Reviewed and stable  Complications: No apparent anesthesia complications

## 2011-04-13 NOTE — Transfer of Care (Signed)
Immediate Anesthesia Transfer of Care Note  Patient: Autumn Farrell  Procedure(s) Performed:  LAPAROSCOPIC CHOLECYSTECTOMY WITH INTRAOPERATIVE CHOLANGIOGRAM  Patient Location: PACU  Anesthesia Type: General  Level of Consciousness: awake, oriented, sedated, patient cooperative and responds to stimulation  Airway & Oxygen Therapy: Patient Spontanous Breathing and Patient connected to face mask oxygen  Post-op Assessment: Report given to PACU RN, Post -op Vital signs reviewed and stable, Patient moving all extremities and Patient moving all extremities X 4  Post vital signs: Reviewed and stable  Complications: No apparent anesthesia complications

## 2011-04-13 NOTE — ED Provider Notes (Signed)
Medical screening examination/treatment/procedure(s) were performed by non-physician practitioner and as supervising physician I was immediately available for consultation/collaboration.  Toy Baker, MD 04/13/11 [phone]

## 2011-04-13 NOTE — Op Note (Signed)
OPERATIVE REPORT  DATE OF OPERATION: 04/12/2011 - 04/13/2011  PATIENT:  Autumn Farrell  35 y.o. female  PRE-OPERATIVE DIAGNOSIS:  cholecystitis  POST-OPERATIVE DIAGNOSIS:  Cholelithiasis and cholecystitis  PROCEDURE:  Procedure(s): LAPAROSCOPIC CHOLECYSTECTOMY WITH INTRAOPERATIVE CHOLANGIOGRAM  SURGEON:  Surgeon(s): Cherylynn Ridges III, MD  ASSISTANT: None  ANESTHESIA:   general  EBL: 30 ml  BLOOD ADMINISTERED: none  DRAINS: none   SPECIMEN:  Source of Specimen:  gallbladder  COUNTS CORRECT:  YES  PROCEDURE DETAILS: The patient was taken to the operating room and placed on the table in the supine position. After an adequate time out was performed identifying the patient and the procedure to be performed she was prepped and draped in usual sterile manner exposing the entire abdomen.  A supraumbilical midline incision was made using a #15 blade and taken down to the midline fascia. With Army-Navy retractors in place we were able to make the fascia using a 15 blade and grabbed the edges were called collapse. We bluntly dissected down into the peritoneal cavity using a Kelly clamp. Once this was done a Pershing suture of 0 Vicryl was passed from the vaginal opening. A Hassan cannula was subsequently passed into the perineum cavity and secured in place with a purse string suture. Carbon dioxide gas insufflation was instilled into the peritoneal cavity up to maximal pressure of 15 mm of mercury.  Subsequently 2 right upper quadrant 5 mm cannulas in the subxiphoid 5 mm cannula was passed under direct vision. The patient was placed in reverse Trendelenburg in the left side was tilted down.  There were some adhesions to the dome of the gallbladder and also to the liver edge. The gallbladder was tense with distention and edema. A Nazjhat aspirated was used to decompress the gallbladder and it was retracted towards the right upper quadrant and the anterior abdominal wall. Using a second  grasper the infundibulum was retracted. We dissected out the peritoneum overlying the triangle of flow the hepatoduodenal truncal. There is a lot of inflammation and edema. We were able to find the junction of the cystic duct the neck of the gallbladder. We also were able to identify the cystic artery in the triangle of flow. The cystic artery was dissected free and proximally and distally ligated using endoclips. We transected the cystic artery. We then dissected out the cystic duct clipped along the gallbladder side. A cholecystotomy was performed using laparoscopic scissors perceptually allowed a Cook catheter which been passed through the anterior abdominal wall to be passed with cholangiogram.  The cholangiogram showed good flow to the duodenum or proximal filling but no intraductal filling defects and a small common bile duct. Once the cholangiogram was completed the clip securing the catheter in place was removed the catheter was removed and the distal cystic duct was ligated x2 using endoclips. Then dissected out the gallbladder from its bed with some difficulty only because of the intense amount of inflammation and edema. Once it was completely detached from the liver there was minimal bleeding we used an Endo Catch bag to retrieved it from the peritoneal cavity. Once I was performed we inspected the liver bed for bleeding there was minimal bleeding.  We irrigated with about a liter of saline solution. We aspirated of fluid and gas from above the liver. We closed the supraumbilical fascial site using a Pershing suture which was in place. Once that was done we aspirated all fluid and gas from above the liver again removed all cannulas and  closed.  Quarter percent Marcaine with epinephrine was injected in all sites. The supraumbilical skin site was closed using running subcuticular stitch of 4-0 Monocryl. Dermabond Steri-Strips and Tegaderms use complete our dressings all needle counts sponge counts and  instrument counts were correct  PATIENT DISPOSITION:  PACU - hemodynamically stable.   Nandana Krolikowski III,Elinora Weigand O 12/23/201211:52 AM

## 2011-04-13 NOTE — Preoperative (Signed)
Beta Blockers   Reason not to administer Beta Blockers:Not Applicable 

## 2011-04-13 NOTE — Anesthesia Preprocedure Evaluation (Signed)
Anesthesia Evaluation  Patient identified by MRN, date of birth, ID band Patient awake    Reviewed: Allergy & Precautions, H&P , NPO status , Patient's Chart, lab work & pertinent test results  History of Anesthesia Complications (+) PONVHistory of anesthetic complications: PONV after c-section.  Airway Mallampati: I TM Distance: >3 FB Neck ROM: Full    Dental No notable dental hx. (+) Teeth Intact and Dental Advisory Given   Pulmonary sleep apnea and Continuous Positive Airway Pressure Ventilation ,  clear to auscultation  Pulmonary exam normal       Cardiovascular hypertension (HCTZ), Pt. on medications Regular Normal    Neuro/Psych Negative Neurological ROS     GI/Hepatic Neg liver ROS, GERD-  Controlled,  Endo/Other  Morbid obesity  Renal/GU negative Renal ROS     Musculoskeletal   Abdominal (+) obese,   Peds  Hematology   Anesthesia Other Findings   Reproductive/Obstetrics LMP 3 weeks ago                           Anesthesia Physical Anesthesia Plan  ASA: III  Anesthesia Plan: General   Post-op Pain Management:    Induction: Intravenous  Airway Management Planned: Oral ETT  Additional Equipment:   Intra-op Plan:   Post-operative Plan: Extubation in OR  Informed Consent: I have reviewed the patients History and Physical, chart, labs and discussed the procedure including the risks, benefits and alternatives for the proposed anesthesia with the patient or authorized representative who has indicated his/her understanding and acceptance.   Dental advisory given  Plan Discussed with: CRNA and Surgeon  Anesthesia Plan Comments: (Plan routine monitors, GETA)        Anesthesia Quick Evaluation

## 2011-04-14 MED ORDER — HYDROCODONE-ACETAMINOPHEN 5-325 MG PO TABS: 1.0000 | ORAL_TABLET | ORAL | Status: AC | PRN

## 2011-04-14 NOTE — Progress Notes (Signed)
Patient ID: Autumn Farrell, female   DOB: January 16, 1976, 35 y.o.   MRN: [id number] 1 Day Post-Op  Subjective: Feels well, just sore. Tolerating diet.  Objective: Vital signs in last 24 hours: Temp:  [97.5 F (36.4 C)-98.5 F (36.9 C)] 98 F (36.7 C) (12/24 0532) Pulse Rate:  [72-99] 89  (12/24 0532) Resp:  [14-30] 17  (12/24 0532) BP: (103-151)/(58-86) 125/78 mmHg (12/24 0532) SpO2:  [96 %-100 %] 98 % (12/24 0532) Last BM Date: 04/12/11  Intake/Output from previous day: 12/23 0701 - 12/24 0700 In: 3060 [P.O.:480; I.V.:2580] Out: 250 [Urine:200; Blood:50] Intake/Output this shift:    General appearance: alert and no distress GI: normal findings: soft, non-tender Incision/Wound:clean and dry  Lab Results:   [facility] 04/12/11 0655  WBC 9.3  HGB 14.4  HCT 41.2  PLT 240   BMET  Basename 04/12/11 0655  NA 139  K 3.1*  CL 103  CO2 23  GLUCOSE 135*  BUN 12  CREATININE 0.71  CALCIUM 9.8   PT/INR No results found for this basename: LABPROT:2,INR:2 in the last 72 hours ABG No results found for this basename: PHART:2,PCO2:2,PO2:2,HCO3:2 in the last 72 hours  Studies/Results: Dg Cholangiogram Operative  04/13/2011  *RADIOLOGY REPORT*  Clinical Data:   Cholelithiasis  INTRAOPERATIVE CHOLANGIOGRAM  Technique:  Cholangiographic images from the C-arm fluoroscopic device were submitted for interpretation post-operatively.  Please see the procedural report for the amount of contrast and the fluoroscopy time utilized.  Comparison:  None  Findings:  No persistent filling defects in the common duct. Intrahepatic ducts are incompletely visualized, appearing decompressed centrally. Contrast passes into the duodenum.  IMPRESSION  Negative for retained common duct stone.  Original Report Authenticated By: Osa Craver, M.D.   US Abdomen Complete  04/12/2011  *RADIOLOGY REPORT*  Clinical Data:  Right upper quadrant pain.  Cholecystitis.  COMPLETE ABDOMINAL ULTRASOUND   Comparison:  04/05/2011.  Findings:  Gallbladder:  Gallstones are present within the gallbladder.  The gallbladder wall is borderline at 3 mm.  There is a positive sonographic Murphy's sign.  No pericholecystic fluid is present.  Common bile duct:  5 mm, borderline for age.  If cholecystectomy is performed, intraoperative cholangiogram is recommended.  No common duct stone is identified.  Liver:  No focal lesion identified.  Within normal limits in parenchymal echogenicity.  IVC:  Appears normal.  Pancreas:  No focal abnormality seen.  Spleen:  97 mm.  Normal echotexture.  Right Kidney:  12 cm. Normal echotexture.  Normal central sinus echo complex.  No calculi or hydronephrosis.  Left Kidney:  12.1 cm. Normal echotexture.  Normal central sinus echo complex.  No calculi or hydronephrosis.  Abdominal aorta:  18 mm with anatomic tapering.  IMPRESSION: 1.  Cholelithiasis with positive sonographic Murphy's sign and borderline wall thickening. This is highly suspicious for early acute cholecystitis. 2.Mild prominence of the common bile duct.  Original Report Authenticated By: Andreas Newport, M.D.    Anti-infectives: Anti-infectives     Start     Dose/Rate Route Frequency Ordered Stop   04/12/11 1600   Ampicillin-Sulbactam (UNASYN) 3 g in sodium chloride 0.9 % 100 mL IVPB        3 g 100 mL/hr over 60 Minutes Intravenous Every 6 hours 04/12/11 1247     04/12/11 1530   ceFAZolin (ANCEF) IVPB 2 g/50 mL premix        2 g 100 mL/hr over 30 Minutes Intravenous 60 min pre-op 04/12/11 1518 04/13/11 1000  04/12/11 1330   Ampicillin-Sulbactam (UNASYN) 3 g in sodium chloride 0.9 % 100 mL IVPB        3 g 100 mL/hr over 60 Minutes Intravenous To [facility] 04/12/11 1304 04/13/11 1040          Assessment/Plan: s/p Procedure(s): LAPAROSCOPIC CHOLECYSTECTOMY WITH INTRAOPERATIVE CHOLANGIOGRAM Doing well postop. Okay for discharge today.   LOS: 2 days    Elener Custodio T 04/14/2011

## 2011-04-14 NOTE — Discharge Summary (Signed)
Physician Discharge Summary  Patient ID: Autumn Farrell MRN: [id number] DOB/AGE: May 21, 1975 35 y.o.  Admit date: 04/12/2011 Discharge date: 04/14/2011  Admission Diagnoses: Acute on chronic cholecystits Discharge Diagnoses:  Principal Problem:  *Cholecystitis chronic, acute  Procedure(s): LAPAROSCOPIC CHOLECYSTECTOMY WITH INTRAOPERATIVE CHOLANGIOGRAM  Discharged Condition: good  Hospital Course: Autumn Farrell is an 35 y.o. female with known history of gallstones who has seen Dr. Corliss Skains and was actually scheduled for elective chole on 04/25/11. However, she has had a recurrent of her sxs and she continues to c/o pain. She presented to the ER where a new [place] now shows some wall thickening in addition to her stones. Surgery consult requested.  She was admitted on 12/22 and taken to OR on 12/23 undergoing successful lap chole. She has done well with no post-op issues. She is stable for DC.   Consults: none   Discharge Exam: Blood pressure 125/78, pulse 89, temperature 98 F (36.7 C), temperature source Oral, resp. rate 17, height 5\' 4"  (1.626 m), weight 108.863 kg (240 lb), last menstrual period 04/03/2011, SpO2 98.00%. Lungs: CTA without w/r/r Heart: Regular Abdomen: soft, ND, appropriately tender   Incisions all c/d/i without erythema or hematoma. Ext: No edema or tenderness   Disposition: Home or Self Care  Discharge Orders    Future Appointments: Provider: Department: Dept Phone: Center:   04/18/2011 10:00 AM [facility]  None     Future Orders Please Complete By Expires   Diet - low sodium heart healthy      Increase activity slowly      May shower / Bathe      Remove dressing in 24 hours      Call MD for:  redness, tenderness, or signs of infection (pain, swelling, redness, odor or green/yellow discharge around incision site)      Call MD for:  severe uncontrolled pain      Call MD for:  persistant nausea and vomiting      Call MD for:   temperature >100.4        Current Discharge Medication List    CONTINUE these medications which have CHANGED   Details  HYDROcodone-acetaminophen (NORCO) 5-325 MG per tablet Take 1-2 tablets by mouth every 4 (four) hours as needed for pain. Qty: 20 tablet, Refills: 0      CONTINUE these medications which have NOT CHANGED   Details  hydrochlorothiazide (HYDRODIURIL) 12.5 MG tablet Take 12.5 mg by mouth daily.      Multiple Vitamins-Minerals (MULTIVITAMINS THER. W/MINERALS) TABS Take 1 tablet by mouth daily. Gummy     potassium chloride (KLOR-CON) 10 MEQ CR tablet Take 10 mEq by mouth daily.        STOP taking these medications     ondansetron (ZOFRAN ODT) 8 MG disintegrating tablet        Follow-up Information    Follow up with WYATT Rene Kocher, MD. Make an appointment in 3 weeks.   Contact information:   [facility] [address] [place] [address] [phone]          Signed: Marianna Fuss 04/14/2011, 9:42 AM

## 2011-04-17 ENCOUNTER — Encounter (HOSPITAL_COMMUNITY): Payer: Self-pay | Admitting: General Surgery

## 2011-04-18 ENCOUNTER — Inpatient Hospital Stay (HOSPITAL_COMMUNITY): Admission: RE | Admit: 2011-04-18 | Payer: BC Managed Care – PPO | Source: Ambulatory Visit

## 2011-04-25 ENCOUNTER — Ambulatory Visit (HOSPITAL_COMMUNITY): Admission: RE | Admit: 2011-04-25 | Payer: BC Managed Care – PPO | Source: Ambulatory Visit | Admitting: Surgery

## 2011-04-25 ENCOUNTER — Encounter (HOSPITAL_COMMUNITY): Admission: RE | Payer: Self-pay | Source: Ambulatory Visit

## 2011-04-25 SURGERY — LAPAROSCOPIC CHOLECYSTECTOMY WITH INTRAOPERATIVE CHOLANGIOGRAM
Anesthesia: General

## 2011-05-06 ENCOUNTER — Ambulatory Visit (INDEPENDENT_AMBULATORY_CARE_PROVIDER_SITE_OTHER): Payer: BC Managed Care – PPO | Admitting: General Surgery

## 2011-05-06 ENCOUNTER — Encounter (INDEPENDENT_AMBULATORY_CARE_PROVIDER_SITE_OTHER): Payer: Self-pay | Admitting: General Surgery

## 2011-05-06 VITALS — BP 124/84 | Temp 97.8°F | Ht 64.0 in | Wt 237.0 lb

## 2011-05-06 DIAGNOSIS — Z09 Encounter for follow-up examination after completed treatment for conditions other than malignant neoplasm: Secondary | ICD-10-CM

## 2011-05-06 NOTE — Progress Notes (Signed)
HPI The patient is status post laparoscopic cholecystectomy and doing well.  PE She is normal bowel sounds no abdominal pain and a small amount of scabbing around her umbilical incision.  Studiy review There are no studies to review the  Assessment Doing well status post laparoscopic cholecystectomy.  Plan Return to see me on a p.r.n. basis

## 2011-05-15 ENCOUNTER — Encounter (INDEPENDENT_AMBULATORY_CARE_PROVIDER_SITE_OTHER): Payer: Self-pay

## 2011-10-25 ENCOUNTER — Emergency Department: Payer: Self-pay | Admitting: *Deleted

## 2011-10-25 LAB — CBC
HCT: 42.4 % (ref 35.0–47.0)
HGB: 14.5 g/dL (ref 12.0–16.0)
RBC: 4.81 10*6/uL (ref 3.80–5.20)
WBC: 11.2 10*3/uL — ABNORMAL HIGH (ref 3.6–11.0)

## 2011-10-25 LAB — URINALYSIS, COMPLETE
Ph: 7 (ref 4.5–8.0)
Protein: 30
Specific Gravity: 1.015 (ref 1.003–1.030)
WBC UR: NONE SEEN /HPF (ref 0–5)

## 2011-10-25 LAB — COMPREHENSIVE METABOLIC PANEL
BUN: 12 mg/dL (ref 7–18)
Bilirubin,Total: 0.9 mg/dL (ref 0.2–1.0)
Chloride: 106 mmol/L (ref 98–107)
Creatinine: 0.9 mg/dL (ref 0.60–1.30)
Osmolality: 278 (ref 275–301)
Potassium: 3.4 mmol/L — ABNORMAL LOW (ref 3.5–5.1)
SGPT (ALT): 46 U/L
Total Protein: 7.9 g/dL (ref 6.4–8.2)

## 2011-10-25 LAB — PREGNANCY, URINE: Pregnancy Test, Urine: NEGATIVE m[IU]/mL

## 2012-11-28 IMAGING — US US ABDOMEN COMPLETE
1 series · 14 of 25 positions shown · non-contrast
Comparison: None.

CLINICAL DATA: Abdominal pain.

COMPLETE ABDOMINAL ULTRASOUND

[Series 1: us abdomen complete · 0.32mm/px · 14 of 57 slices shown]
[im 1/57]
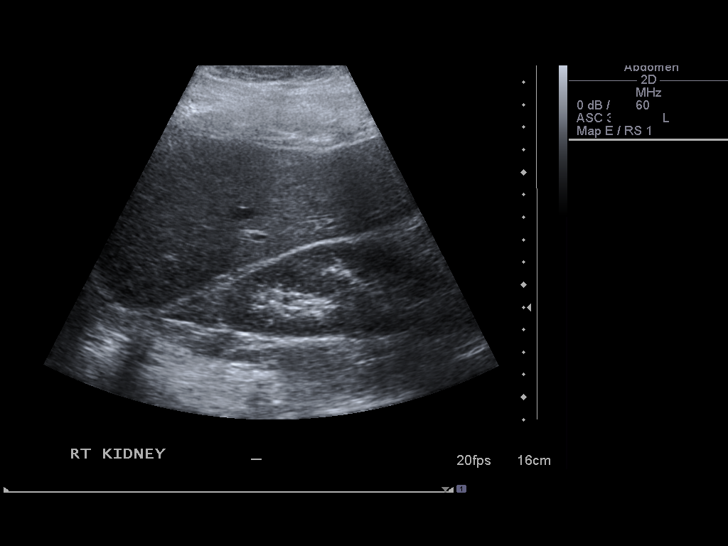
[im 5/57]
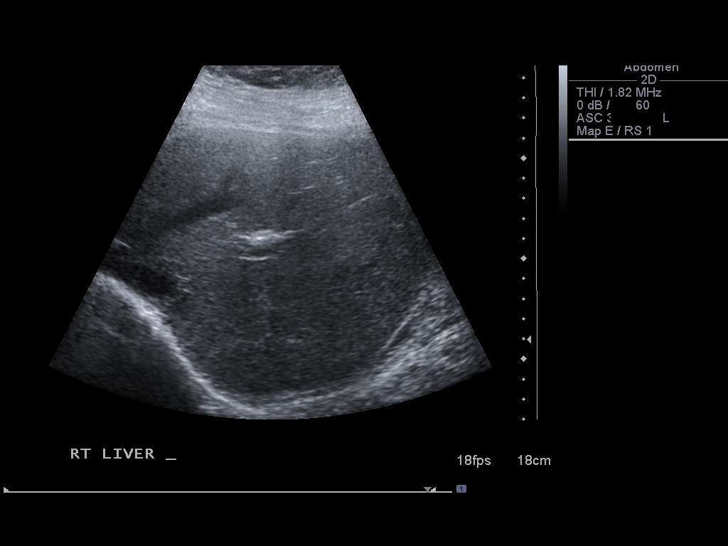
[im 10/57]
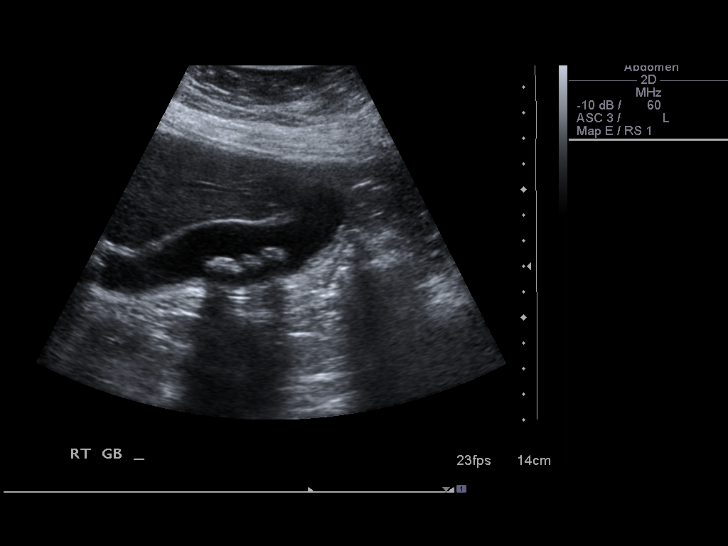
[im 15/57]
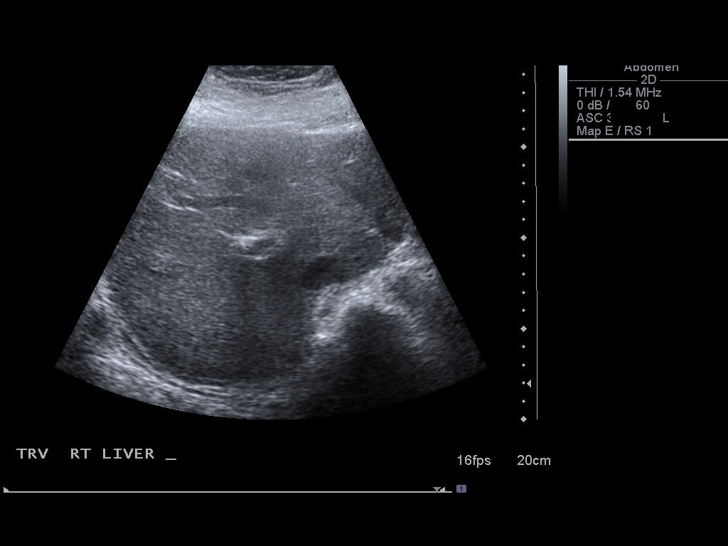
[im 19/57]
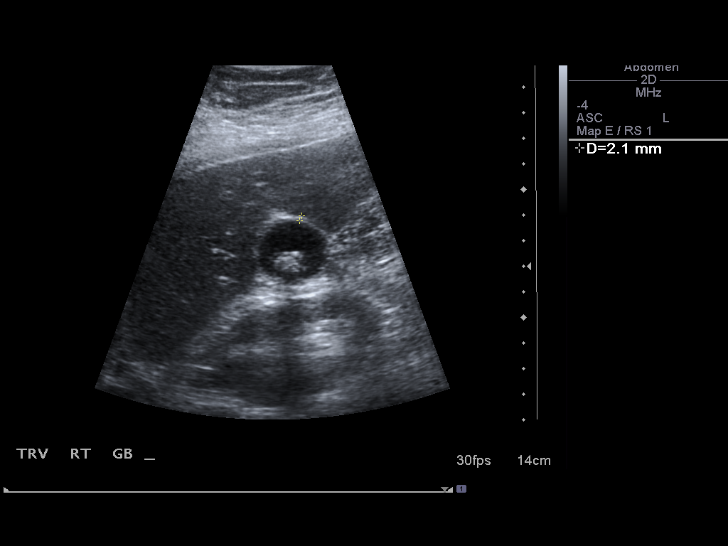
[im 22/57]
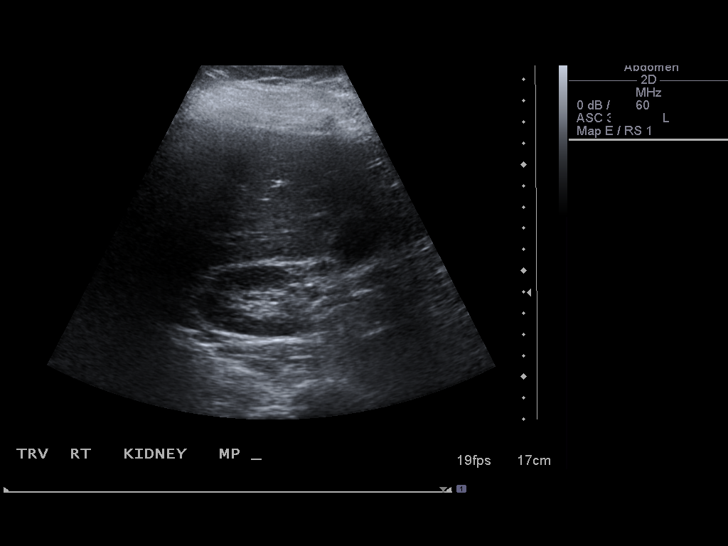
[im 26/57]
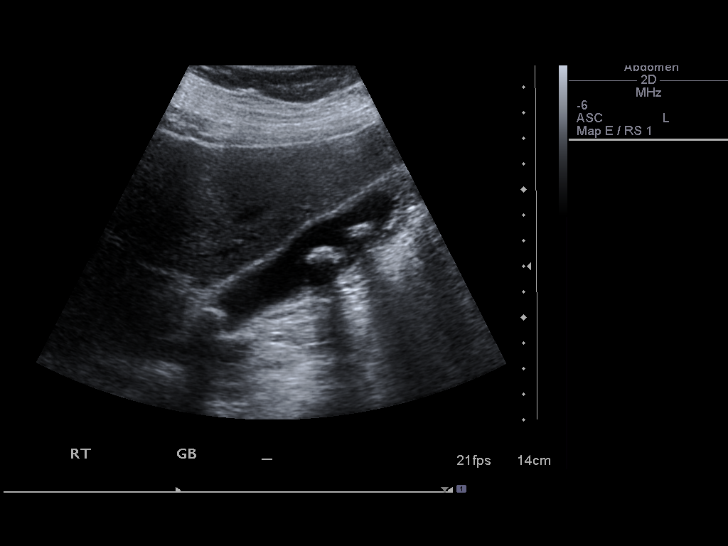
[im 31/57]
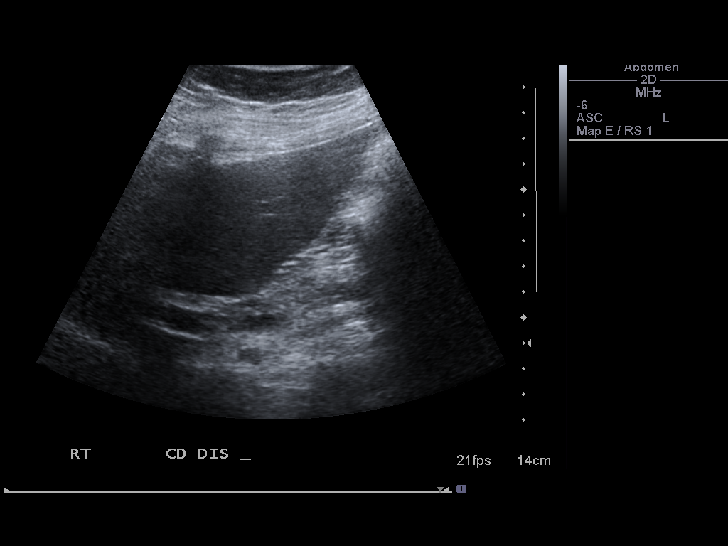
[im 36/57]
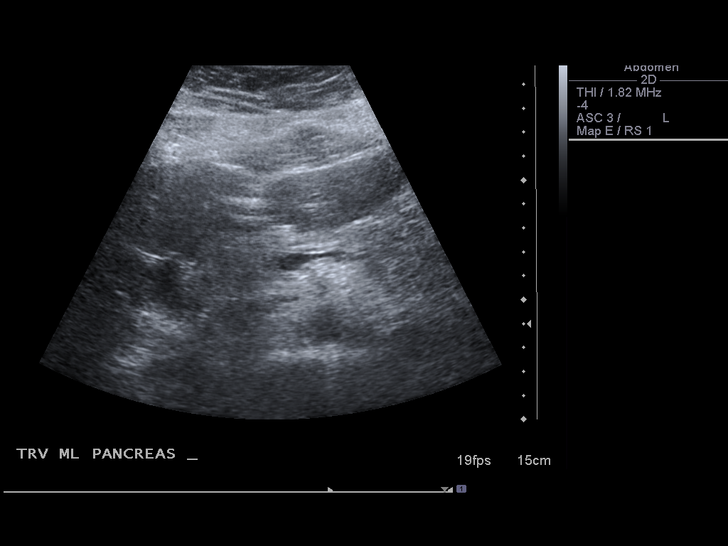
[im 38/57]
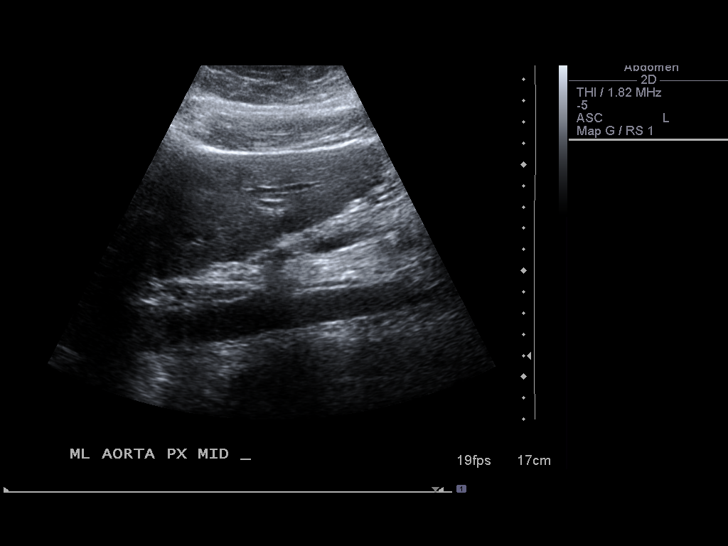
[im 43/57]
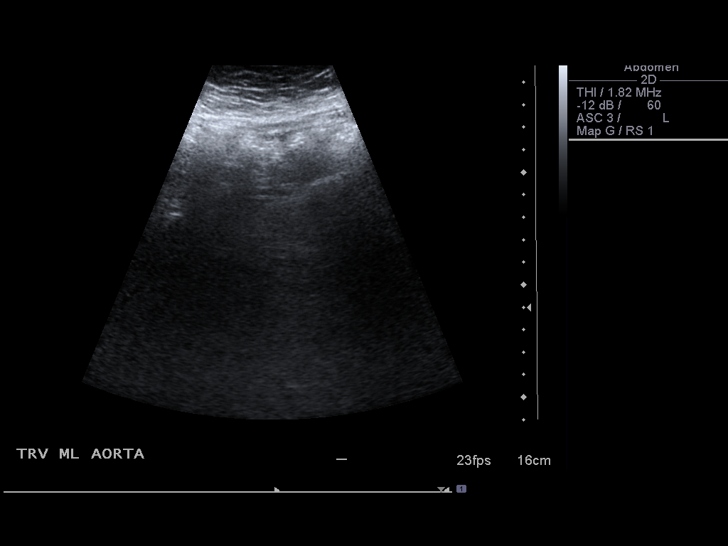
[im 47/57]
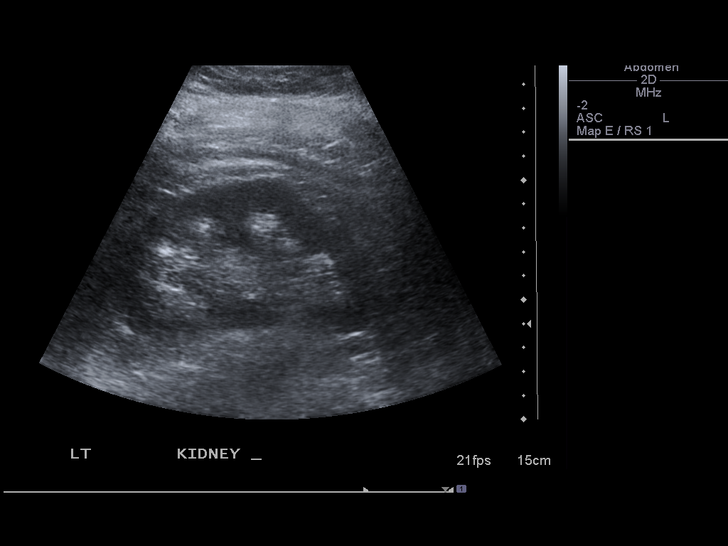
[im 52/57]
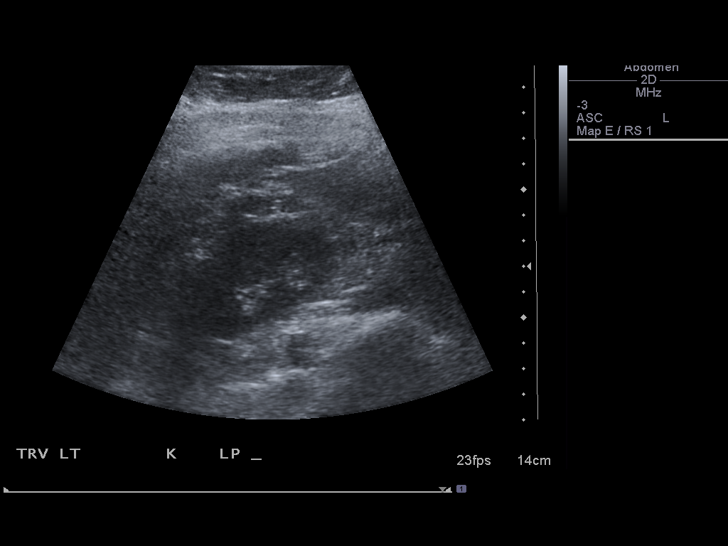
[im 57/57]
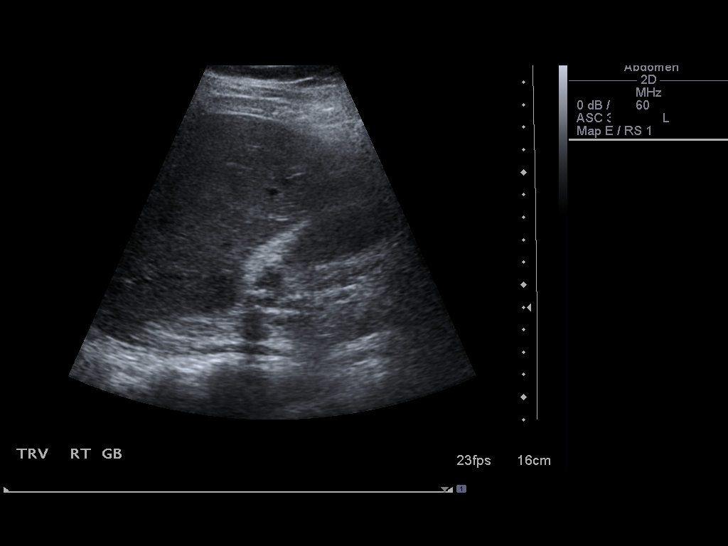

[14 of 25 positions shown; findings below may reference images not displayed]

FINDINGS: Gallbladder:  A few stones are identified in the gallbladder
measuring up to 1.5 cm in diameter.  Stones appear mobile.  No
pericholecystic fluid or gallbladder wall thickening is identified.
Sonographer reports negative Murphy's sign.

Common bile duct:  Measures 0.4 cm.

Liver:  No focal lesion identified.  Within normal limits in
parenchymal echogenicity.

IVC:  Appears normal.

Pancreas:  No focal abnormality seen.

Spleen:  Measures 8.7 cm and appears normal.

Right Kidney:  Measures 11.7 cm and appears normal.

Left Kidney:  Measures 13.2 cm and appears normal.

Abdominal aorta:  No aneurysm identified.
IMPRESSION: A few gallstones are present but there is no ultrasound evidence of
cholecystitis.  The examination is otherwise unremarkable.

## 2012-12-05 IMAGING — US US ABDOMEN COMPLETE
1 series · 14 of 25 positions shown · non-contrast
Comparison: 04/05/2011.

CLINICAL DATA: Right upper quadrant pain.  Cholecystitis.

COMPLETE ABDOMINAL ULTRASOUND

[Series 1: us abdomen complete · 0.30mm/px · 14 of 80 slices shown]
[im 1/80]
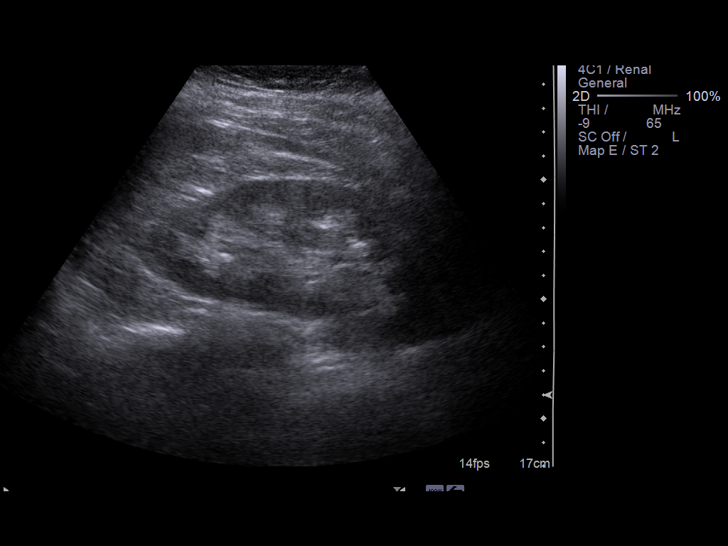
[im 7/80]
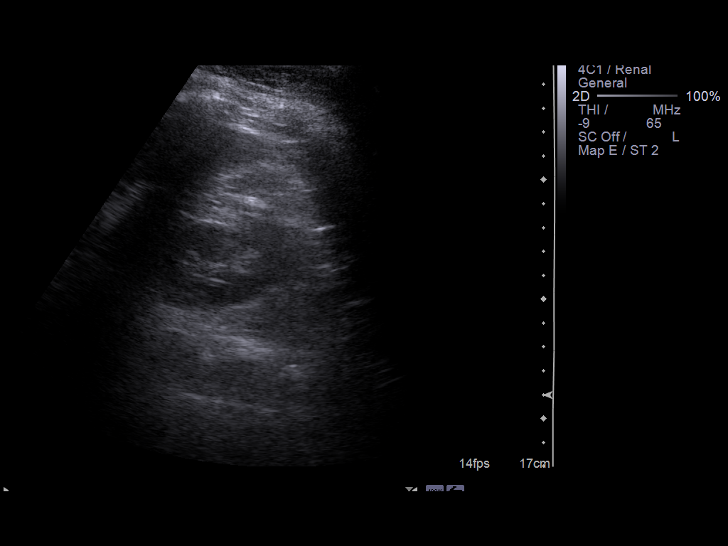
[im 14/80]
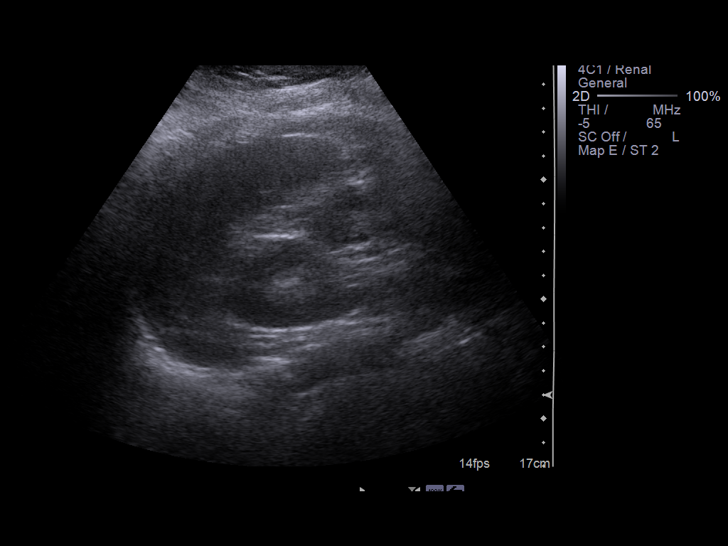
[im 20/80]
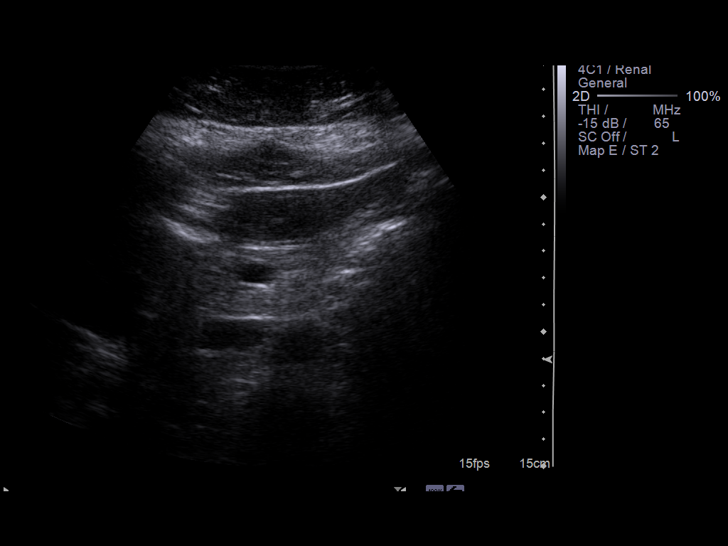
[im 27/80]
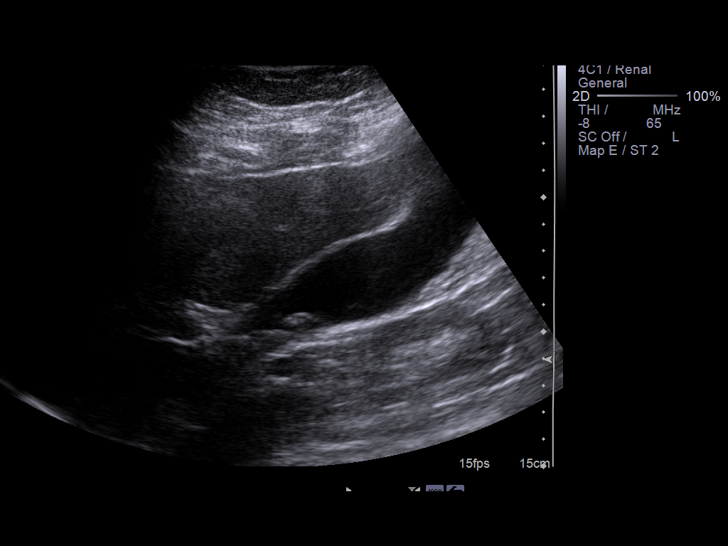
[im 30/80]
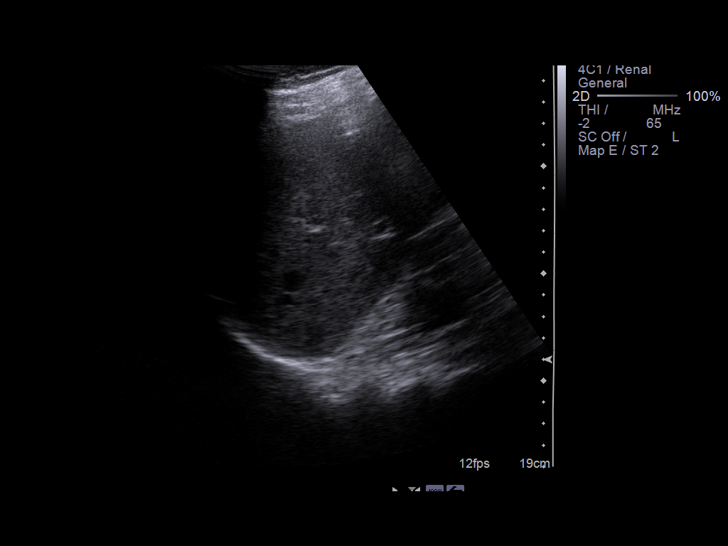
[im 37/80]
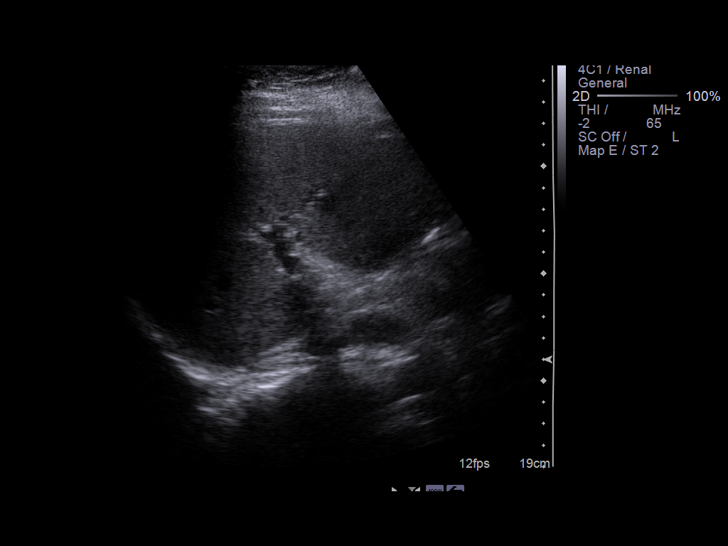
[im 43/80]
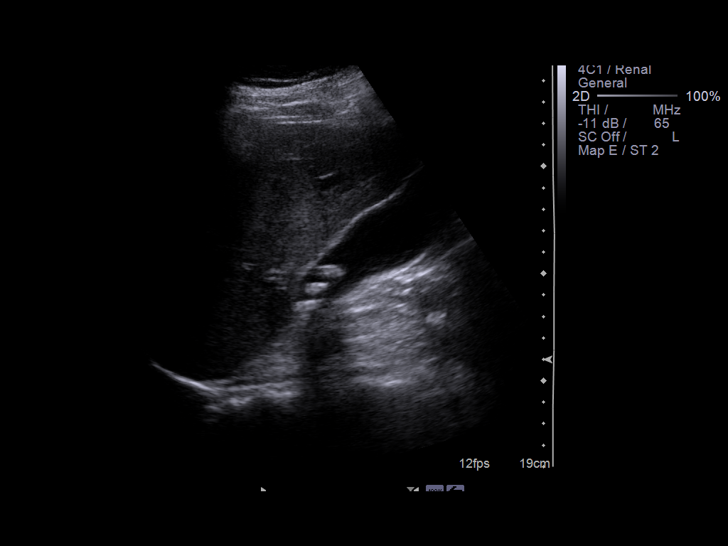
[im 50/80]
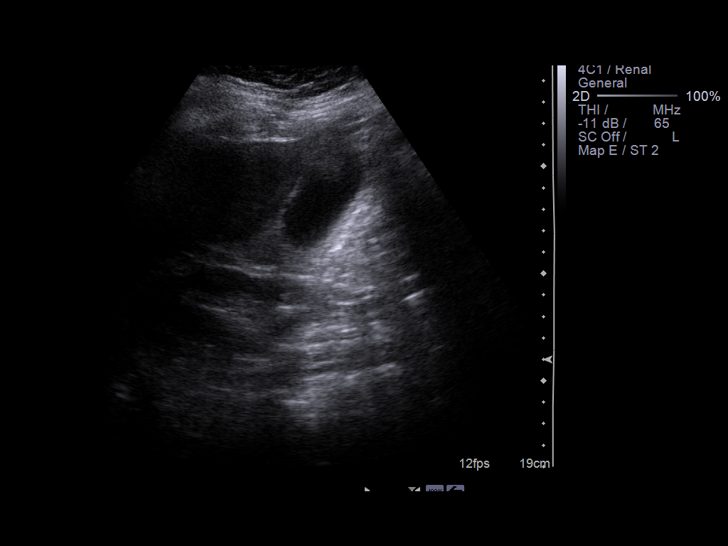
[im 53/80]
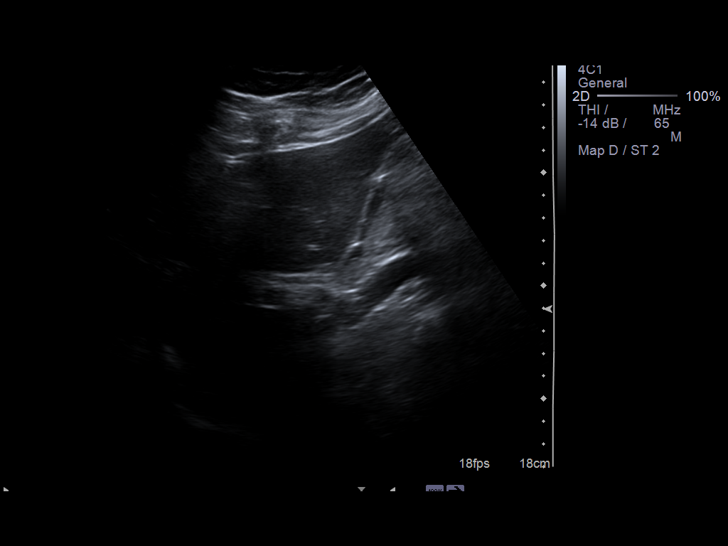
[im 60/80]
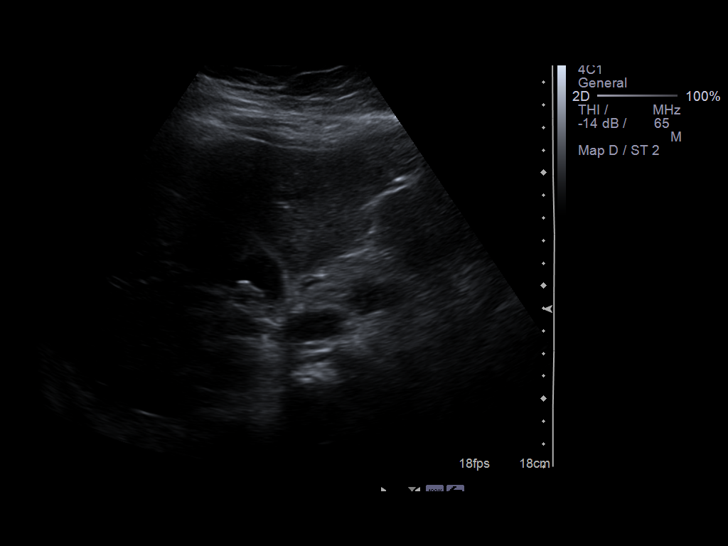
[im 66/80]
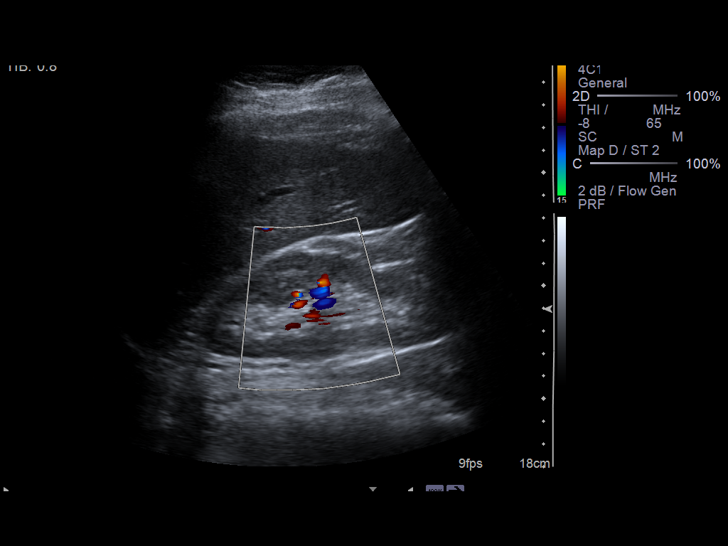
[im 73/80]
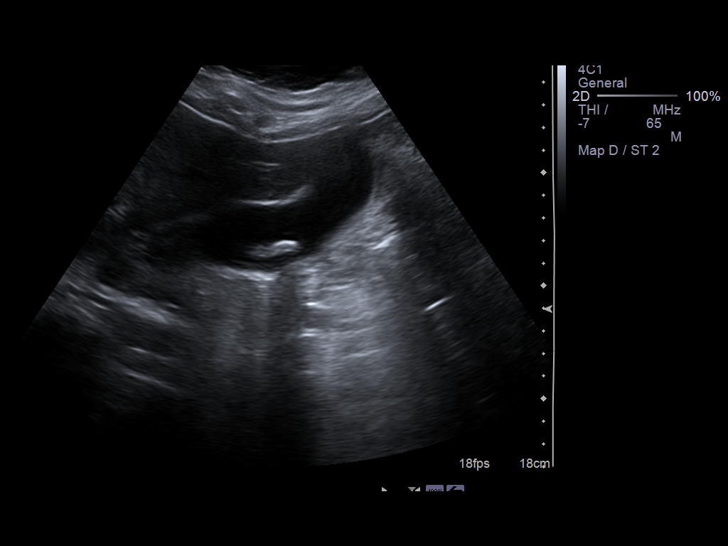
[im 80/80]
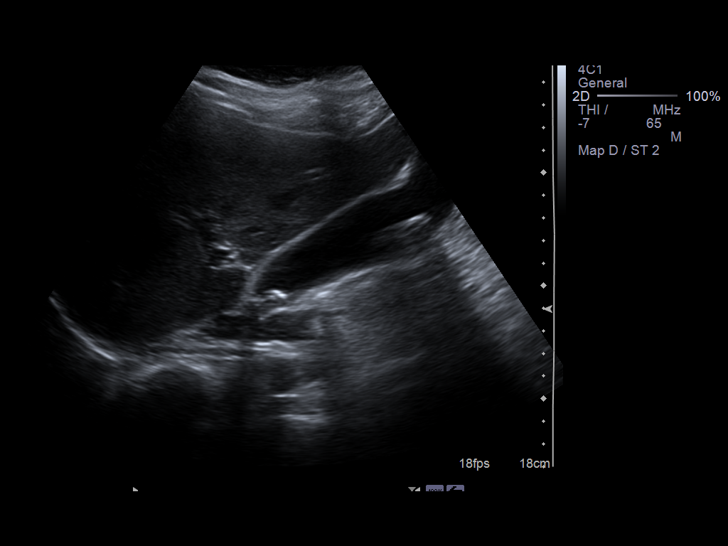

[14 of 25 positions shown; findings below may reference images not displayed]

FINDINGS: Gallbladder:  Gallstones are present within the gallbladder.  The
gallbladder wall is borderline at 3 mm.  There is a positive
sonographic Murphy's sign.  No pericholecystic fluid is present.

Common bile duct:  5 mm, borderline for age.  If cholecystectomy is
performed, intraoperative cholangiogram is recommended.  No common
duct stone is identified.

Liver:  No focal lesion identified.  Within normal limits in
parenchymal echogenicity.

IVC:  Appears normal.

Pancreas:  No focal abnormality seen.

Spleen:  97 mm.  Normal echotexture.

Right Kidney:  12 cm. Normal echotexture.  Normal central sinus
echo complex.  No calculi or hydronephrosis.

Left Kidney:  12.1 cm. Normal echotexture.  Normal central sinus
echo complex.  No calculi or hydronephrosis.

Abdominal aorta:  18 mm with anatomic tapering.
IMPRESSION: 1.  Cholelithiasis with positive sonographic Murphy's sign and
borderline wall thickening. This is highly suspicious for early
acute cholecystitis.
2.Mild prominence of the common bile duct.

## 2013-06-19 IMAGING — CT CT ABD-PELV W/O CM
1 of 2 series · 15 of 32 positions shown, 19 images · non-contrast
Comparison: None

REASON FOR EXAM: (1) flank pain; (2) flank pain
COMMENTS:

PROCEDURE:     CT  - CT ABDOMEN AND PELVIS W[DATE] [DATE]
RESULT:     Indication: Flank Pain
TECHNIQUE: Multiple axial images from the lung bases to the symphysis pubis
were obtained without oral and without intravenous contrast.

[Series 2: 3mm soft tissue · axial · 0.72mm/px · z∈[-937,-541]mm · 15 of 146 slices shown, 19 images]
[im 7/146  soft-tissue]
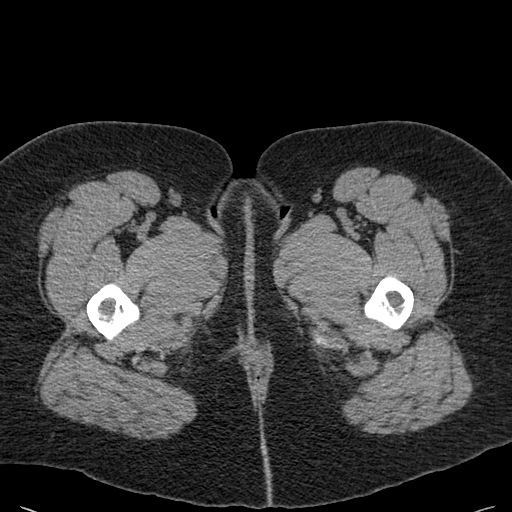
[im 7/146  bone]
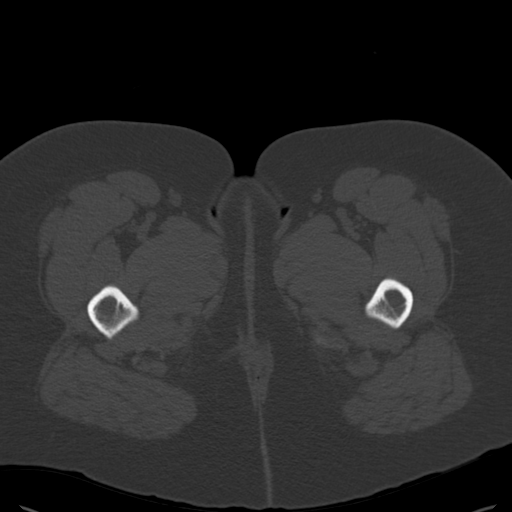
[im 19/146  soft-tissue]
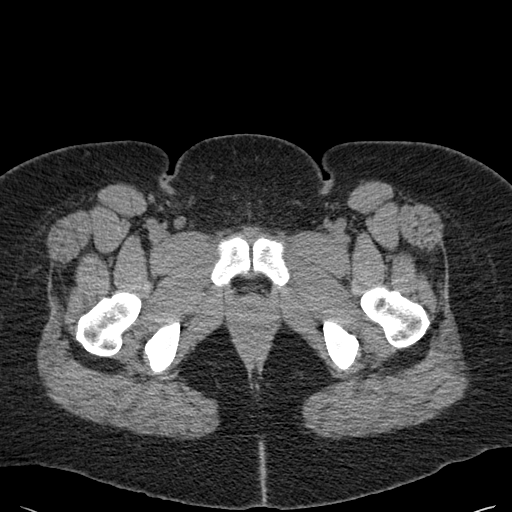
[im 31/146  soft-tissue]
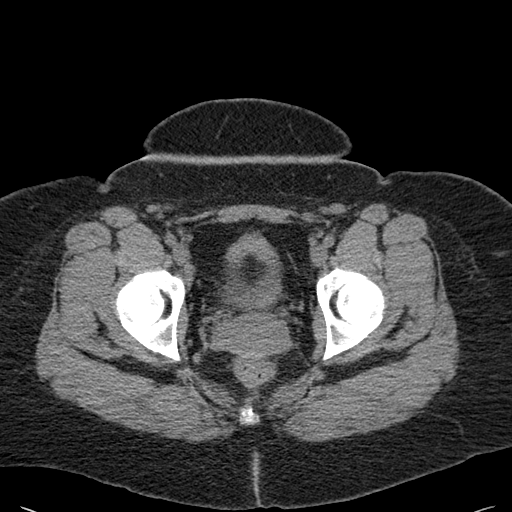
[im 43/146  soft-tissue]
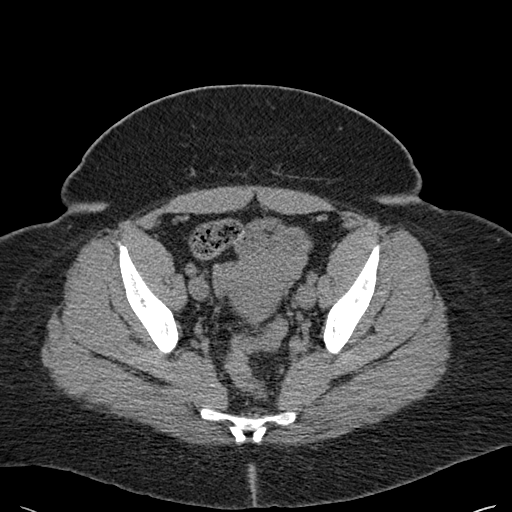
[im 49/146  soft-tissue]
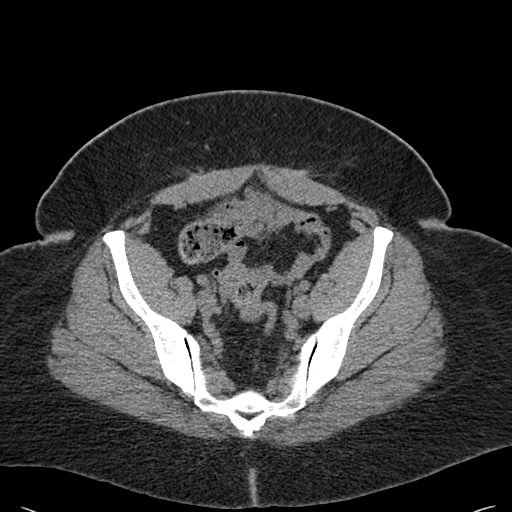
[im 61/146  soft-tissue]
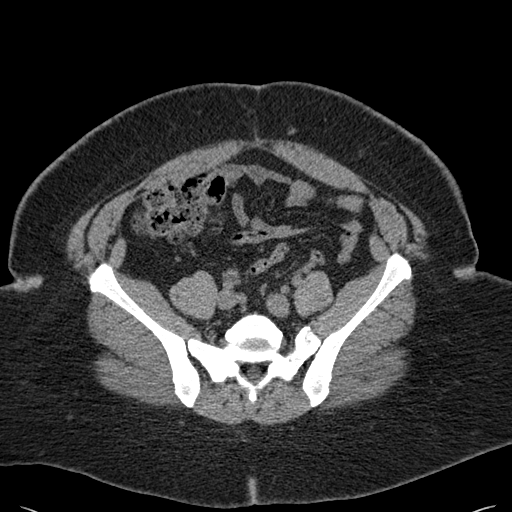
[im 73/146  soft-tissue]
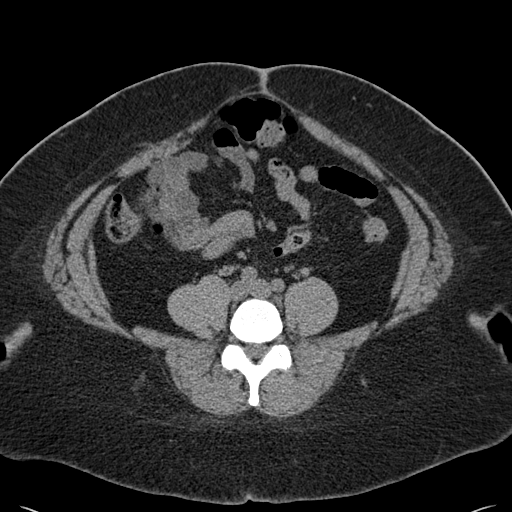
[im 85/146  soft-tissue]
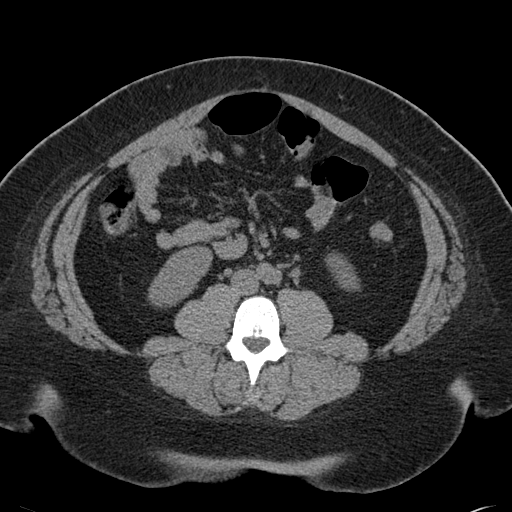
[im 97/146  soft-tissue]
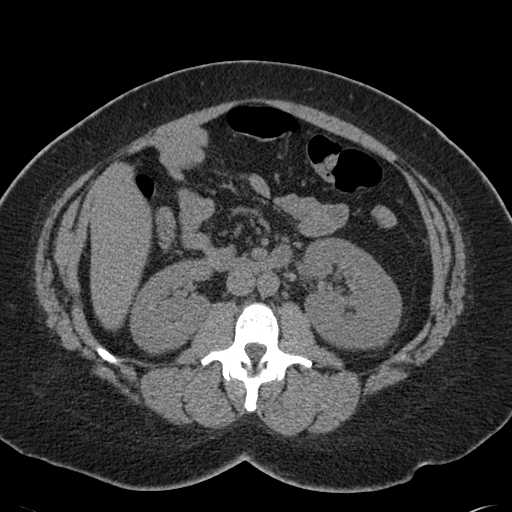
[im 97/146  bone]
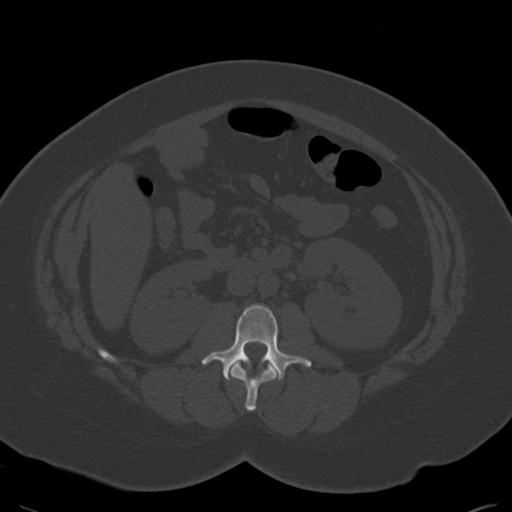
[im 103/146  soft-tissue]
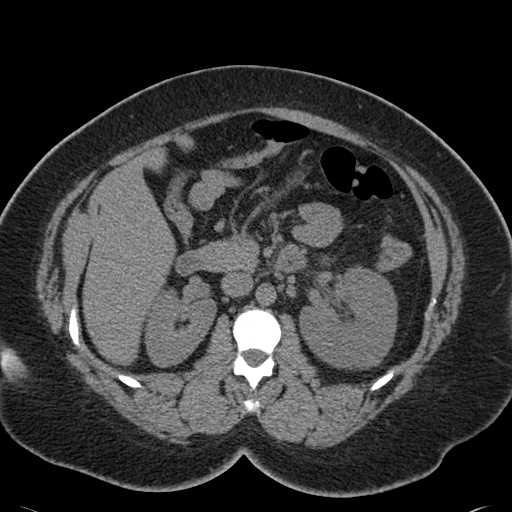
[im 115/146  soft-tissue]
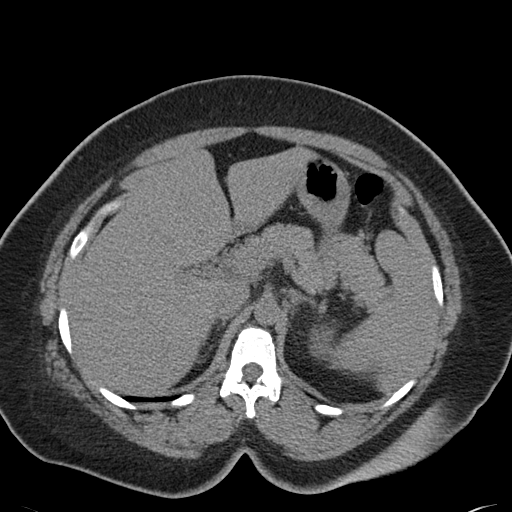
[im 121/146  lung]
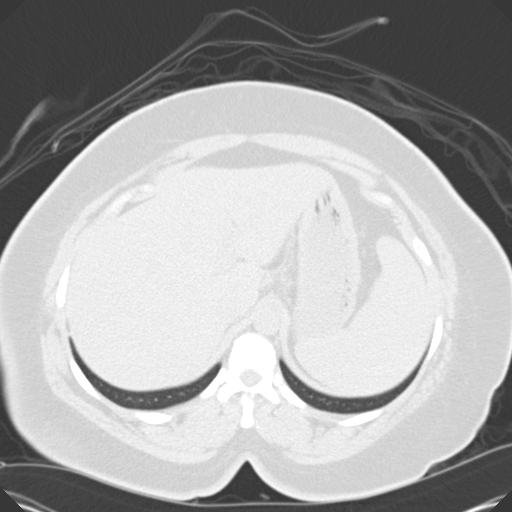
[im 127/146  soft-tissue]
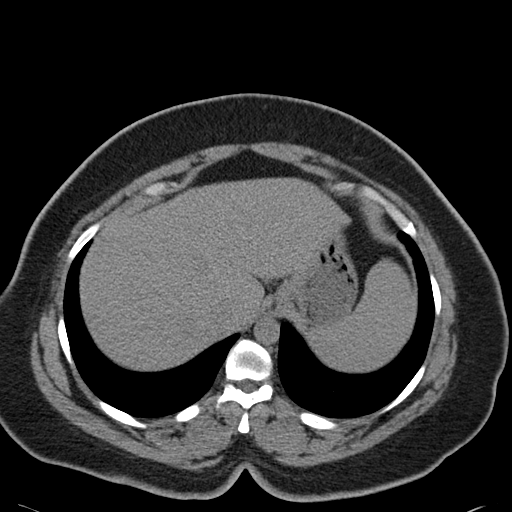
[im 127/146  lung]
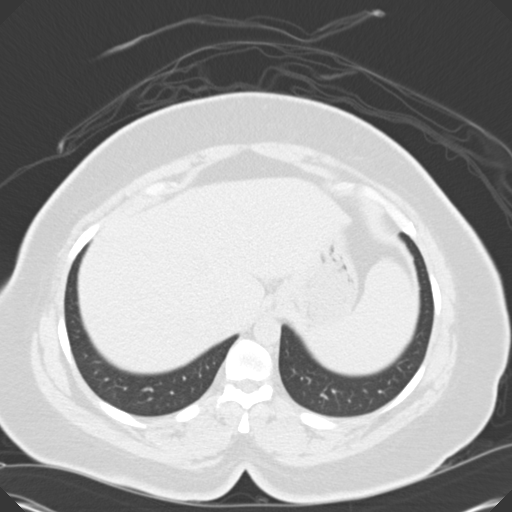
[im 133/146  lung]
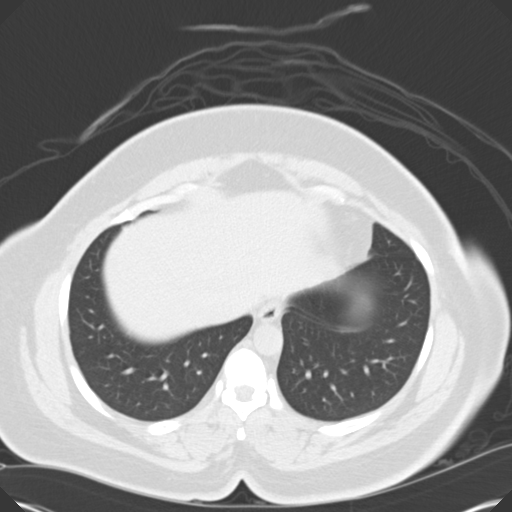
[im 139/146  soft-tissue]
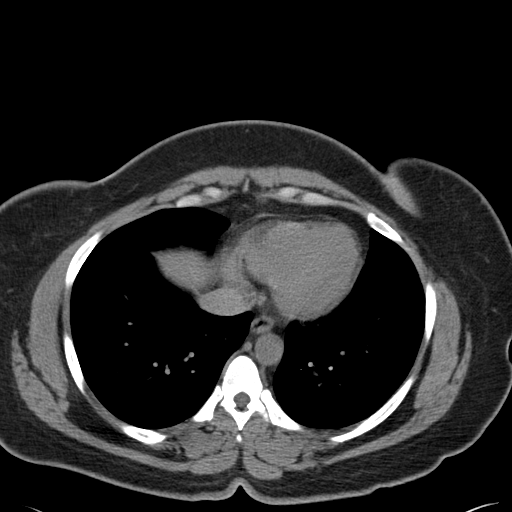
[im 139/146  lung]
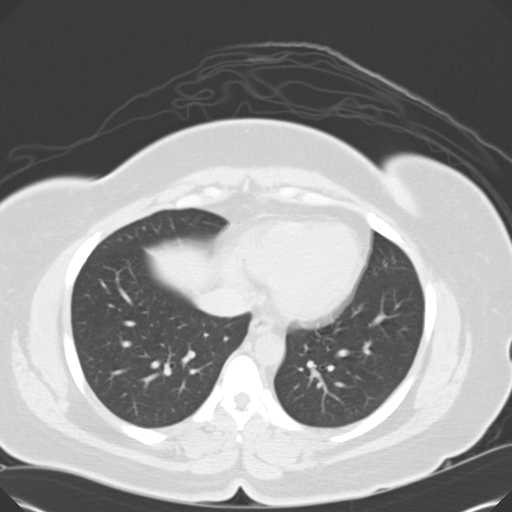

[15 of 32 positions shown; findings below may reference images not displayed]

FINDINGS: The lung bases are clear. There is no pleural or pericardial effusions.

There bilateral renal calculi. There is a 2 mm proximal left ureteral
calculus resulting in minimal left hydronephrosis. The kidneys are symmetric
in size without evidence for exophytic mass. The bladder is unremarkable.

The liver demonstrates no focal abnormality. The gallbladder is surgically
absent. The spleen demonstrates no focal abnormality. The adrenal glands and
pancreas are normal.

The unopacified stomach, duodenum, small intestine, and large intestine are
unremarkable, but evaluation is limited by lack of oral contrast. There is a
normal caliber appendix in the right lower quadrant without periappendiceal
inflammatory changes. There is no pneumoperitoneum, pneumatosis, or portal
venous gas. There is no abdominal or pelvic free fluid. There is no
lymphadenopathy.

The abdominal aorta is normal in caliber .

The osseous structures are unremarkable.
IMPRESSION: 1. There is a 2 mm proximal left ureteral calculus resulting in minimal left
hydronephrosis.

2. Bilateral nephrolithiasis.

[REDACTED]

## 2016-01-24 ENCOUNTER — Emergency Department (HOSPITAL_COMMUNITY)
Admission: EM | Admit: 2016-01-24 | Discharge: 2016-01-25 | Disposition: A | Discharge status: Home / Self Care | Payer: Medicaid Other | Attending: Emergency Medicine | Admitting: Emergency Medicine

## 2016-01-24 ENCOUNTER — Encounter (HOSPITAL_COMMUNITY): Payer: Self-pay

## 2016-01-24 DIAGNOSIS — I10 Essential (primary) hypertension: Secondary | ICD-10-CM | POA: Insufficient documentation

## 2016-01-24 DIAGNOSIS — E860 Dehydration: Secondary | ICD-10-CM | POA: Insufficient documentation

## 2016-01-24 DIAGNOSIS — R000 Tachycardia, unspecified: Secondary | ICD-10-CM | POA: Insufficient documentation

## 2016-01-24 DIAGNOSIS — R197 Diarrhea, unspecified: Secondary | ICD-10-CM | POA: Insufficient documentation

## 2016-01-24 DIAGNOSIS — R112 Nausea with vomiting, unspecified: Secondary | ICD-10-CM | POA: Insufficient documentation

## 2016-01-24 MED ORDER — MORPHINE SULFATE (PF) 4 MG/ML IV SOLN
4.0000 mg | Freq: Once | INTRAVENOUS | Status: AC
  Administered 2016-01-24: 4 mg via INTRAVENOUS
  Filled 2016-01-24: qty 1

## 2016-01-24 MED ORDER — ONDANSETRON HCL 4 MG/2ML IJ SOLN
4.0000 mg | Freq: Once | INTRAMUSCULAR | Status: AC
  Administered 2016-01-24: 4 mg via INTRAVENOUS
  Filled 2016-01-24: qty 2

## 2016-01-24 MED ORDER — SODIUM CHLORIDE 0.9 % IV BOLUS (SEPSIS)
1000.0000 mL | Freq: Once | INTRAVENOUS | Status: AC
  Administered 2016-01-24: 1000 mL via INTRAVENOUS

## 2016-01-24 NOTE — ED Notes (Signed)
Pt. Made aware for the need of urine. 

## 2016-01-24 NOTE — ED Provider Notes (Signed)
[facility]-EMERGENCY DEPT Provider Note   CSN: [id number] Arrival date & time: 01/24/16  2217     History   Chief Complaint Chief Complaint  Patient presents with  . Emesis    HPI Autumn Farrell is a 40 y.o. female.  Pt presents to the ED tonight with n/v/d.  The pt said that it started when she got home from work around 1900.  She said that she has not been exposed to anyone who has been sick.  She has left lower back pain, but no abdominal pain.  No fevers.      Past Medical History:  Diagnosis Date  . Gallstones   . Hypertension     Patient Active Problem List   Diagnosis Date Noted  . Postop check 05/06/2011  . Cholecystitis chronic, acute 04/12/2011  . 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 CESAREAN SECTION    . CHOLECYSTECTOMY  04/13/2011   Procedure: LAPAROSCOPIC CHOLECYSTECTOMY WITH INTRAOPERATIVE CHOLANGIOGRAM;  Surgeon: Jetty DuhamelJames O Wyatt III, MD;  Location: [facility] OR;  Service: General;  Laterality: N/A;    OB History    No data available       Home Medications    Prior to Admission medications   Medication Sig Start Date End Date Taking? Authorizing Provider  hydrochlorothiazide (HYDRODIURIL) 12.5 MG tablet Take 12.5 mg by mouth daily.     Yes Historical Provider, MD  norethindrone-ethinyl estradiol (JUNEL FE 1/20) 1-20 MG-MCG tablet Take 1 tablet by mouth daily. 10/29/15  Yes Historical Provider, MD  potassium chloride (KLOR-CON) 10 MEQ CR tablet Take 10 mEq by mouth daily.     Yes Historical Provider, MD  ondansetron (ZOFRAN ODT) 4 MG disintegrating tablet Take 1 tablet (4 mg total) by mouth every 8 (eight) hours as needed for nausea or vomiting. 01/25/16   Derwood KaplanAnkit Nanavati, MD    Family History History reviewed. No pertinent family history.  Social History Social History  Substance Use Topics  . Smoking status: Never Smoker  . Smokeless tobacco: Never Used  . Alcohol use No     Allergies     Review of patient's allergies indicates no known allergies.   Review of Systems Review of Systems  Gastrointestinal: Positive for diarrhea, nausea and vomiting.  All other systems reviewed and are negative.    Physical Exam Updated Vital Signs BP 181/98 (BP Location: Right Arm) Comment: pt did not take BP medications tonight  Pulse 96   Temp 98.8 F (37.1 C) (Oral)   Resp 20   LMP 01/10/2016   SpO2 100%   Physical Exam  Constitutional: She is oriented to person, place, and time. She appears well-developed and well-nourished.  HENT:  Head: Normocephalic and atraumatic.  Right Ear: External ear normal.  Left Ear: External ear normal.  Nose: Nose normal.  Mouth/Throat: Oropharynx is clear and moist.  Eyes: Conjunctivae and EOM are normal. Pupils are equal, round, and reactive to light.  Neck: Normal range of motion. Neck supple.  Cardiovascular: Regular rhythm, normal heart sounds and intact distal pulses.  Tachycardia present.   Pulmonary/Chest: Effort normal and breath sounds normal.  Abdominal: Soft. Bowel sounds are normal.  Musculoskeletal: Normal range of motion.  Neurological: She is alert and oriented to person, place, and time.  Skin: Skin is warm.  Psychiatric: She has a normal mood and affect. Her behavior is normal. Judgment and thought content normal.  Nursing note and vitals reviewed.    ED  Treatments / Results  Labs (all labs ordered are listed, but only abnormal results are displayed) Labs Reviewed  COMPREHENSIVE METABOLIC PANEL - Abnormal; Notable for the following:       Result Value   Potassium 3.3 (*)    Glucose, Bld 153 (*)    Calcium 8.8 (*)    All other components within normal limits  CBC WITH DIFFERENTIAL/PLATELET - Abnormal; Notable for the following:    WBC 13.8 (*)    Neutro Abs 11.7 (*)    All other components within normal limits  URINALYSIS, ROUTINE W REFLEX MICROSCOPIC (NOT AT [facility]) - Abnormal; Notable for the following:     APPearance CLOUDY (*)    Ketones, ur 15 (*)    Protein, ur 100 (*)    All other components within normal limits  URINE MICROSCOPIC-ADD ON - Abnormal; Notable for the following:    Squamous Epithelial / LPF 0-5 (*)    Bacteria, UA FEW (*)    All other components within normal limits  PREGNANCY, URINE  LIPASE, BLOOD    EKG  EKG Interpretation None       Radiology No results found.  Procedures Procedures (including critical care time)  Medications Ordered in ED Medications  sodium chloride 0.9 % bolus 1,000 mL (0 mLs Intravenous Stopped 01/25/16 0127)  morphine 4 MG/ML injection 4 mg (4 mg Intravenous Given 01/24/16 2344)  ondansetron (ZOFRAN) injection 4 mg (4 mg Intravenous Given 01/24/16 2344)  ondansetron (ZOFRAN-ODT) disintegrating tablet 4 mg (4 mg Oral Given 01/25/16 0219)     Initial Impression / Assessment and Plan / ED Course  I have reviewed the triage vital signs and the nursing notes.  Pertinent labs & imaging results that were available during my care of the patient were reviewed by me and considered in my medical decision making (see chart for details).  Clinical Course   Pt is feeling much better.   Final Clinical Impressions(s) / ED Diagnoses   Final diagnoses:  Non-intractable vomiting with nausea, unspecified vomiting type  Diarrhea, unspecified type  Dehydration    New Prescriptions Discharge Medication List as of 01/25/2016  2:18 AM    START taking these medications   Details  ondansetron (ZOFRAN ODT) 4 MG disintegrating tablet Take 1 tablet (4 mg total) by mouth every 8 (eight) hours as needed for nausea or vomiting., Starting Fri 01/25/2016, Print         Jacalyn Lefevre, MD 02/01/16 1549

## 2016-01-24 NOTE — ED Notes (Signed)
Bed: [facility] Expected date:  Expected time:  Means of arrival:  Comments: 

## 2016-01-24 NOTE — ED Triage Notes (Signed)
Pt started vomiting and having diarrhea about 7pm

## 2016-01-25 LAB — URINALYSIS, ROUTINE W REFLEX MICROSCOPIC
Bilirubin Urine: NEGATIVE
Glucose, UA: NEGATIVE mg/dL
HGB URINE DIPSTICK: NEGATIVE
Ketones, ur: 15 mg/dL — AB
Leukocytes, UA: NEGATIVE
NITRITE: NEGATIVE
PROTEIN: 100 mg/dL — AB
SPECIFIC GRAVITY, URINE: 1.02 (ref 1.005–1.030)
pH: 7 (ref 5.0–8.0)

## 2016-01-25 LAB — COMPREHENSIVE METABOLIC PANEL
ALT: 25 U/L (ref 14–54)
AST: 26 U/L (ref 15–41)
Albumin: 3.8 g/dL (ref 3.5–5.0)
Alkaline Phosphatase: 52 U/L (ref 38–126)
Anion gap: 9 (ref 5–15)
BILIRUBIN TOTAL: 1 mg/dL (ref 0.3–1.2)
BUN: 14 mg/dL (ref 6–20)
CALCIUM: 8.8 mg/dL — AB (ref 8.9–10.3)
CHLORIDE: 104 mmol/L (ref 101–111)
CO2: 25 mmol/L (ref 22–32)
CREATININE: 0.65 mg/dL (ref 0.44–1.00)
Glucose, Bld: 153 mg/dL — ABNORMAL HIGH (ref 65–99)
Potassium: 3.3 mmol/L — ABNORMAL LOW (ref 3.5–5.1)
Sodium: 138 mmol/L (ref 135–145)
TOTAL PROTEIN: 7.7 g/dL (ref 6.5–8.1)

## 2016-01-25 LAB — CBC WITH DIFFERENTIAL/PLATELET
BASOS ABS: 0 10*3/uL (ref 0.0–0.1)
BASOS PCT: 0 %
EOS ABS: 0 10*3/uL (ref 0.0–0.7)
EOS PCT: 0 %
HCT: 40.8 % (ref 36.0–46.0)
HEMOGLOBIN: 14.1 g/dL (ref 12.0–15.0)
Lymphocytes Relative: 9 %
Lymphs Abs: 1.2 10*3/uL (ref 0.7–4.0)
MCH: 29.6 pg (ref 26.0–34.0)
MCHC: 34.6 g/dL (ref 30.0–36.0)
MCV: 85.7 fL (ref 78.0–100.0)
Monocytes Absolute: 0.9 10*3/uL (ref 0.1–1.0)
Monocytes Relative: 7 %
NEUTROS PCT: 84 %
Neutro Abs: 11.7 10*3/uL — ABNORMAL HIGH (ref 1.7–7.7)
PLATELETS: 254 10*3/uL (ref 150–400)
RBC: 4.76 MIL/uL (ref 3.87–5.11)
RDW: 12.5 % (ref 11.5–15.5)
WBC: 13.8 10*3/uL — AB (ref 4.0–10.5)

## 2016-01-25 LAB — URINE MICROSCOPIC-ADD ON

## 2016-01-25 LAB — LIPASE, BLOOD: LIPASE: 24 U/L (ref 11–51)

## 2016-01-25 LAB — PREGNANCY, URINE: PREG TEST UR: NEGATIVE

## 2016-01-25 MED ORDER — ONDANSETRON 4 MG PO TBDP
4.0000 mg | ORAL_TABLET | Freq: Three times a day (TID) | ORAL | 0 refills | Status: AC | PRN
Start: 2016-01-25 — End: ?

## 2016-01-25 MED ORDER — ONDANSETRON 4 MG PO TBDP
4.0000 mg | ORAL_TABLET | Freq: Once | ORAL | Status: AC
  Administered 2016-01-25: 4 mg via ORAL
  Filled 2016-01-25: qty 1

## 2016-01-25 NOTE — ED Notes (Signed)
Patient is resting comfortably.
